# Patient Record
Sex: Male | Born: 1945 | Race: White | Hispanic: No | Marital: Married | Smoking: Former smoker
Health system: Southern US, Community
[De-identification: ages and names within clinical notes are randomized; demographics above are authoritative.]

## PROBLEM LIST (undated history)

## (undated) DIAGNOSIS — R55 Syncope and collapse: Secondary | ICD-10-CM

## (undated) DIAGNOSIS — I495 Sick sinus syndrome: Secondary | ICD-10-CM

## (undated) DIAGNOSIS — Z87898 Personal history of other specified conditions: Secondary | ICD-10-CM

## (undated) DIAGNOSIS — M1990 Unspecified osteoarthritis, unspecified site: Secondary | ICD-10-CM

## (undated) DIAGNOSIS — E782 Mixed hyperlipidemia: Secondary | ICD-10-CM

## (undated) DIAGNOSIS — I2510 Atherosclerotic heart disease of native coronary artery without angina pectoris: Secondary | ICD-10-CM

## (undated) HISTORY — PX: CERVICAL FUSION: SHX112

## (undated) HISTORY — DX: Mixed hyperlipidemia: E78.2

## (undated) HISTORY — DX: Sick sinus syndrome: I49.5

## (undated) HISTORY — DX: Atherosclerotic heart disease of native coronary artery without angina pectoris: I25.10

## (undated) HISTORY — DX: Syncope and collapse: R55

## (undated) HISTORY — PX: HERNIA REPAIR: SHX51

## (undated) HISTORY — PX: SHOULDER ARTHROSCOPY W/ ROTATOR CUFF REPAIR: SHX2400

## (undated) HISTORY — DX: Unspecified osteoarthritis, unspecified site: M19.90

## (undated) HISTORY — DX: Personal history of other specified conditions: Z87.898

## (undated) HISTORY — PX: OTHER SURGICAL HISTORY: SHX169

## (undated) HISTORY — PX: CERVICAL DISC SURGERY: SHX588

---

## 2003-03-16 ENCOUNTER — Ambulatory Visit (HOSPITAL_COMMUNITY): Admission: RE | Admit: 2003-03-16 | Discharge: 2003-03-17 | Payer: Self-pay | Admitting: Orthopaedic Surgery

## 2004-02-09 ENCOUNTER — Encounter: Admission: RE | Admit: 2004-02-09 | Discharge: 2004-02-09 | Payer: Self-pay | Admitting: Orthopaedic Surgery

## 2004-03-21 ENCOUNTER — Inpatient Hospital Stay (HOSPITAL_COMMUNITY): Admission: RE | Admit: 2004-03-21 | Discharge: 2004-03-23 | Payer: Self-pay | Admitting: Orthopaedic Surgery

## 2015-03-07 DIAGNOSIS — R1084 Generalized abdominal pain: Secondary | ICD-10-CM | POA: Diagnosis not present

## 2015-03-08 DIAGNOSIS — M4806 Spinal stenosis, lumbar region: Secondary | ICD-10-CM | POA: Diagnosis not present

## 2015-03-08 DIAGNOSIS — M5136 Other intervertebral disc degeneration, lumbar region: Secondary | ICD-10-CM | POA: Diagnosis not present

## 2015-03-08 DIAGNOSIS — Z79899 Other long term (current) drug therapy: Secondary | ICD-10-CM | POA: Diagnosis not present

## 2015-03-08 DIAGNOSIS — M47817 Spondylosis without myelopathy or radiculopathy, lumbosacral region: Secondary | ICD-10-CM | POA: Diagnosis not present

## 2015-03-08 DIAGNOSIS — Z87891 Personal history of nicotine dependence: Secondary | ICD-10-CM | POA: Diagnosis not present

## 2015-03-08 DIAGNOSIS — Z981 Arthrodesis status: Secondary | ICD-10-CM | POA: Diagnosis not present

## 2015-03-15 DIAGNOSIS — M1990 Unspecified osteoarthritis, unspecified site: Secondary | ICD-10-CM | POA: Diagnosis not present

## 2015-03-15 DIAGNOSIS — Z9889 Other specified postprocedural states: Secondary | ICD-10-CM | POA: Diagnosis not present

## 2015-03-15 DIAGNOSIS — Z7982 Long term (current) use of aspirin: Secondary | ICD-10-CM | POA: Diagnosis not present

## 2015-03-15 DIAGNOSIS — Z79899 Other long term (current) drug therapy: Secondary | ICD-10-CM | POA: Diagnosis not present

## 2015-03-15 DIAGNOSIS — M549 Dorsalgia, unspecified: Secondary | ICD-10-CM | POA: Diagnosis not present

## 2015-03-15 DIAGNOSIS — M8589 Other specified disorders of bone density and structure, multiple sites: Secondary | ICD-10-CM | POA: Diagnosis not present

## 2015-03-15 DIAGNOSIS — M810 Age-related osteoporosis without current pathological fracture: Secondary | ICD-10-CM | POA: Diagnosis not present

## 2015-04-04 DIAGNOSIS — M5440 Lumbago with sciatica, unspecified side: Secondary | ICD-10-CM | POA: Diagnosis not present

## 2015-04-05 DIAGNOSIS — M5136 Other intervertebral disc degeneration, lumbar region: Secondary | ICD-10-CM | POA: Diagnosis not present

## 2015-04-05 DIAGNOSIS — M47817 Spondylosis without myelopathy or radiculopathy, lumbosacral region: Secondary | ICD-10-CM | POA: Diagnosis not present

## 2015-04-06 DIAGNOSIS — M5440 Lumbago with sciatica, unspecified side: Secondary | ICD-10-CM | POA: Diagnosis not present

## 2015-04-10 DIAGNOSIS — M5440 Lumbago with sciatica, unspecified side: Secondary | ICD-10-CM | POA: Diagnosis not present

## 2015-04-12 DIAGNOSIS — M5440 Lumbago with sciatica, unspecified side: Secondary | ICD-10-CM | POA: Diagnosis not present

## 2015-04-17 DIAGNOSIS — M5440 Lumbago with sciatica, unspecified side: Secondary | ICD-10-CM | POA: Diagnosis not present

## 2015-04-19 DIAGNOSIS — M5440 Lumbago with sciatica, unspecified side: Secondary | ICD-10-CM | POA: Diagnosis not present

## 2015-04-24 DIAGNOSIS — M5440 Lumbago with sciatica, unspecified side: Secondary | ICD-10-CM | POA: Diagnosis not present

## 2015-04-26 DIAGNOSIS — M5440 Lumbago with sciatica, unspecified side: Secondary | ICD-10-CM | POA: Diagnosis not present

## 2015-05-01 DIAGNOSIS — M5440 Lumbago with sciatica, unspecified side: Secondary | ICD-10-CM | POA: Diagnosis not present

## 2015-05-03 DIAGNOSIS — M5440 Lumbago with sciatica, unspecified side: Secondary | ICD-10-CM | POA: Diagnosis not present

## 2015-05-08 DIAGNOSIS — M5440 Lumbago with sciatica, unspecified side: Secondary | ICD-10-CM | POA: Diagnosis not present

## 2015-05-10 DIAGNOSIS — M5440 Lumbago with sciatica, unspecified side: Secondary | ICD-10-CM | POA: Diagnosis not present

## 2015-06-19 DIAGNOSIS — M47816 Spondylosis without myelopathy or radiculopathy, lumbar region: Secondary | ICD-10-CM | POA: Diagnosis not present

## 2015-06-19 DIAGNOSIS — M9903 Segmental and somatic dysfunction of lumbar region: Secondary | ICD-10-CM | POA: Diagnosis not present

## 2015-06-19 DIAGNOSIS — M5441 Lumbago with sciatica, right side: Secondary | ICD-10-CM | POA: Diagnosis not present

## 2015-06-22 DIAGNOSIS — M47816 Spondylosis without myelopathy or radiculopathy, lumbar region: Secondary | ICD-10-CM | POA: Diagnosis not present

## 2015-06-22 DIAGNOSIS — M4806 Spinal stenosis, lumbar region: Secondary | ICD-10-CM | POA: Diagnosis not present

## 2015-06-22 DIAGNOSIS — M9903 Segmental and somatic dysfunction of lumbar region: Secondary | ICD-10-CM | POA: Diagnosis not present

## 2015-06-22 DIAGNOSIS — M5441 Lumbago with sciatica, right side: Secondary | ICD-10-CM | POA: Diagnosis not present

## 2015-06-23 DIAGNOSIS — Z6826 Body mass index (BMI) 26.0-26.9, adult: Secondary | ICD-10-CM | POA: Diagnosis not present

## 2015-06-23 DIAGNOSIS — M545 Low back pain: Secondary | ICD-10-CM | POA: Diagnosis not present

## 2015-06-23 DIAGNOSIS — Z125 Encounter for screening for malignant neoplasm of prostate: Secondary | ICD-10-CM | POA: Diagnosis not present

## 2015-06-23 DIAGNOSIS — Z0000 Encounter for general adult medical examination without abnormal findings: Secondary | ICD-10-CM | POA: Diagnosis not present

## 2015-06-23 DIAGNOSIS — I10 Essential (primary) hypertension: Secondary | ICD-10-CM | POA: Diagnosis not present

## 2015-07-03 DIAGNOSIS — M5441 Lumbago with sciatica, right side: Secondary | ICD-10-CM | POA: Diagnosis not present

## 2015-07-03 DIAGNOSIS — M4806 Spinal stenosis, lumbar region: Secondary | ICD-10-CM | POA: Diagnosis not present

## 2015-07-03 DIAGNOSIS — L570 Actinic keratosis: Secondary | ICD-10-CM | POA: Diagnosis not present

## 2015-07-03 DIAGNOSIS — M9903 Segmental and somatic dysfunction of lumbar region: Secondary | ICD-10-CM | POA: Diagnosis not present

## 2015-07-03 DIAGNOSIS — Z85828 Personal history of other malignant neoplasm of skin: Secondary | ICD-10-CM | POA: Diagnosis not present

## 2015-07-03 DIAGNOSIS — M47816 Spondylosis without myelopathy or radiculopathy, lumbar region: Secondary | ICD-10-CM | POA: Diagnosis not present

## 2015-07-07 DIAGNOSIS — M9903 Segmental and somatic dysfunction of lumbar region: Secondary | ICD-10-CM | POA: Diagnosis not present

## 2015-07-07 DIAGNOSIS — M5441 Lumbago with sciatica, right side: Secondary | ICD-10-CM | POA: Diagnosis not present

## 2015-07-07 DIAGNOSIS — M4806 Spinal stenosis, lumbar region: Secondary | ICD-10-CM | POA: Diagnosis not present

## 2015-07-07 DIAGNOSIS — M47816 Spondylosis without myelopathy or radiculopathy, lumbar region: Secondary | ICD-10-CM | POA: Diagnosis not present

## 2015-07-24 DIAGNOSIS — M9903 Segmental and somatic dysfunction of lumbar region: Secondary | ICD-10-CM | POA: Diagnosis not present

## 2015-07-24 DIAGNOSIS — M47816 Spondylosis without myelopathy or radiculopathy, lumbar region: Secondary | ICD-10-CM | POA: Diagnosis not present

## 2015-07-24 DIAGNOSIS — M5441 Lumbago with sciatica, right side: Secondary | ICD-10-CM | POA: Diagnosis not present

## 2015-07-24 DIAGNOSIS — M4806 Spinal stenosis, lumbar region: Secondary | ICD-10-CM | POA: Diagnosis not present

## 2015-07-26 DIAGNOSIS — M47816 Spondylosis without myelopathy or radiculopathy, lumbar region: Secondary | ICD-10-CM | POA: Diagnosis not present

## 2015-07-26 DIAGNOSIS — M9903 Segmental and somatic dysfunction of lumbar region: Secondary | ICD-10-CM | POA: Diagnosis not present

## 2015-07-26 DIAGNOSIS — M4806 Spinal stenosis, lumbar region: Secondary | ICD-10-CM | POA: Diagnosis not present

## 2015-07-26 DIAGNOSIS — M5441 Lumbago with sciatica, right side: Secondary | ICD-10-CM | POA: Diagnosis not present

## 2015-08-01 DIAGNOSIS — M9903 Segmental and somatic dysfunction of lumbar region: Secondary | ICD-10-CM | POA: Diagnosis not present

## 2015-08-01 DIAGNOSIS — M4806 Spinal stenosis, lumbar region: Secondary | ICD-10-CM | POA: Diagnosis not present

## 2015-08-01 DIAGNOSIS — M47816 Spondylosis without myelopathy or radiculopathy, lumbar region: Secondary | ICD-10-CM | POA: Diagnosis not present

## 2015-08-01 DIAGNOSIS — M5441 Lumbago with sciatica, right side: Secondary | ICD-10-CM | POA: Diagnosis not present

## 2015-08-15 DIAGNOSIS — I10 Essential (primary) hypertension: Secondary | ICD-10-CM | POA: Diagnosis not present

## 2015-08-15 DIAGNOSIS — M545 Low back pain: Secondary | ICD-10-CM | POA: Diagnosis not present

## 2015-08-15 DIAGNOSIS — M9903 Segmental and somatic dysfunction of lumbar region: Secondary | ICD-10-CM | POA: Diagnosis not present

## 2015-08-15 DIAGNOSIS — I700 Atherosclerosis of aorta: Secondary | ICD-10-CM | POA: Diagnosis not present

## 2015-08-15 DIAGNOSIS — M47816 Spondylosis without myelopathy or radiculopathy, lumbar region: Secondary | ICD-10-CM | POA: Diagnosis not present

## 2015-08-15 DIAGNOSIS — Z6826 Body mass index (BMI) 26.0-26.9, adult: Secondary | ICD-10-CM | POA: Diagnosis not present

## 2015-08-15 DIAGNOSIS — M4806 Spinal stenosis, lumbar region: Secondary | ICD-10-CM | POA: Diagnosis not present

## 2015-08-15 DIAGNOSIS — M5441 Lumbago with sciatica, right side: Secondary | ICD-10-CM | POA: Diagnosis not present

## 2015-08-22 DIAGNOSIS — M5441 Lumbago with sciatica, right side: Secondary | ICD-10-CM | POA: Diagnosis not present

## 2015-08-22 DIAGNOSIS — M47816 Spondylosis without myelopathy or radiculopathy, lumbar region: Secondary | ICD-10-CM | POA: Diagnosis not present

## 2015-08-22 DIAGNOSIS — M9903 Segmental and somatic dysfunction of lumbar region: Secondary | ICD-10-CM | POA: Diagnosis not present

## 2015-08-22 DIAGNOSIS — M4806 Spinal stenosis, lumbar region: Secondary | ICD-10-CM | POA: Diagnosis not present

## 2015-09-14 DIAGNOSIS — M9903 Segmental and somatic dysfunction of lumbar region: Secondary | ICD-10-CM | POA: Diagnosis not present

## 2015-09-14 DIAGNOSIS — M5441 Lumbago with sciatica, right side: Secondary | ICD-10-CM | POA: Diagnosis not present

## 2015-09-14 DIAGNOSIS — M47816 Spondylosis without myelopathy or radiculopathy, lumbar region: Secondary | ICD-10-CM | POA: Diagnosis not present

## 2015-09-14 DIAGNOSIS — M4806 Spinal stenosis, lumbar region: Secondary | ICD-10-CM | POA: Diagnosis not present

## 2015-09-18 DIAGNOSIS — M9903 Segmental and somatic dysfunction of lumbar region: Secondary | ICD-10-CM | POA: Diagnosis not present

## 2015-09-18 DIAGNOSIS — M47816 Spondylosis without myelopathy or radiculopathy, lumbar region: Secondary | ICD-10-CM | POA: Diagnosis not present

## 2015-09-18 DIAGNOSIS — M4806 Spinal stenosis, lumbar region: Secondary | ICD-10-CM | POA: Diagnosis not present

## 2015-09-18 DIAGNOSIS — M5441 Lumbago with sciatica, right side: Secondary | ICD-10-CM | POA: Diagnosis not present

## 2015-09-21 DIAGNOSIS — M47816 Spondylosis without myelopathy or radiculopathy, lumbar region: Secondary | ICD-10-CM | POA: Diagnosis not present

## 2015-09-21 DIAGNOSIS — M4806 Spinal stenosis, lumbar region: Secondary | ICD-10-CM | POA: Diagnosis not present

## 2015-09-21 DIAGNOSIS — M5441 Lumbago with sciatica, right side: Secondary | ICD-10-CM | POA: Diagnosis not present

## 2015-09-21 DIAGNOSIS — M9903 Segmental and somatic dysfunction of lumbar region: Secondary | ICD-10-CM | POA: Diagnosis not present

## 2015-09-25 DIAGNOSIS — M4806 Spinal stenosis, lumbar region: Secondary | ICD-10-CM | POA: Diagnosis not present

## 2015-09-25 DIAGNOSIS — M9903 Segmental and somatic dysfunction of lumbar region: Secondary | ICD-10-CM | POA: Diagnosis not present

## 2015-09-25 DIAGNOSIS — M5441 Lumbago with sciatica, right side: Secondary | ICD-10-CM | POA: Diagnosis not present

## 2015-09-25 DIAGNOSIS — M47816 Spondylosis without myelopathy or radiculopathy, lumbar region: Secondary | ICD-10-CM | POA: Diagnosis not present

## 2015-09-27 DIAGNOSIS — R1032 Left lower quadrant pain: Secondary | ICD-10-CM | POA: Diagnosis not present

## 2015-09-28 DIAGNOSIS — M9903 Segmental and somatic dysfunction of lumbar region: Secondary | ICD-10-CM | POA: Diagnosis not present

## 2015-09-28 DIAGNOSIS — M47816 Spondylosis without myelopathy or radiculopathy, lumbar region: Secondary | ICD-10-CM | POA: Diagnosis not present

## 2015-09-28 DIAGNOSIS — M4806 Spinal stenosis, lumbar region: Secondary | ICD-10-CM | POA: Diagnosis not present

## 2015-09-28 DIAGNOSIS — M5441 Lumbago with sciatica, right side: Secondary | ICD-10-CM | POA: Diagnosis not present

## 2015-10-05 DIAGNOSIS — M9903 Segmental and somatic dysfunction of lumbar region: Secondary | ICD-10-CM | POA: Diagnosis not present

## 2015-10-05 DIAGNOSIS — M4806 Spinal stenosis, lumbar region: Secondary | ICD-10-CM | POA: Diagnosis not present

## 2015-10-05 DIAGNOSIS — M47816 Spondylosis without myelopathy or radiculopathy, lumbar region: Secondary | ICD-10-CM | POA: Diagnosis not present

## 2015-10-05 DIAGNOSIS — M5441 Lumbago with sciatica, right side: Secondary | ICD-10-CM | POA: Diagnosis not present

## 2015-10-06 DIAGNOSIS — R1032 Left lower quadrant pain: Secondary | ICD-10-CM | POA: Diagnosis not present

## 2015-10-06 DIAGNOSIS — R1030 Lower abdominal pain, unspecified: Secondary | ICD-10-CM | POA: Diagnosis not present

## 2015-10-06 DIAGNOSIS — I861 Scrotal varices: Secondary | ICD-10-CM | POA: Diagnosis not present

## 2015-10-09 DIAGNOSIS — M47816 Spondylosis without myelopathy or radiculopathy, lumbar region: Secondary | ICD-10-CM | POA: Diagnosis not present

## 2015-10-09 DIAGNOSIS — M9903 Segmental and somatic dysfunction of lumbar region: Secondary | ICD-10-CM | POA: Diagnosis not present

## 2015-10-09 DIAGNOSIS — M4806 Spinal stenosis, lumbar region: Secondary | ICD-10-CM | POA: Diagnosis not present

## 2015-10-09 DIAGNOSIS — M5441 Lumbago with sciatica, right side: Secondary | ICD-10-CM | POA: Diagnosis not present

## 2015-10-11 DIAGNOSIS — I861 Scrotal varices: Secondary | ICD-10-CM | POA: Diagnosis not present

## 2015-10-12 DIAGNOSIS — M9903 Segmental and somatic dysfunction of lumbar region: Secondary | ICD-10-CM | POA: Diagnosis not present

## 2015-10-12 DIAGNOSIS — M4806 Spinal stenosis, lumbar region: Secondary | ICD-10-CM | POA: Diagnosis not present

## 2015-10-12 DIAGNOSIS — M5441 Lumbago with sciatica, right side: Secondary | ICD-10-CM | POA: Diagnosis not present

## 2015-10-12 DIAGNOSIS — M47816 Spondylosis without myelopathy or radiculopathy, lumbar region: Secondary | ICD-10-CM | POA: Diagnosis not present

## 2015-10-18 DIAGNOSIS — M4806 Spinal stenosis, lumbar region: Secondary | ICD-10-CM | POA: Diagnosis not present

## 2015-10-18 DIAGNOSIS — M47816 Spondylosis without myelopathy or radiculopathy, lumbar region: Secondary | ICD-10-CM | POA: Diagnosis not present

## 2015-10-18 DIAGNOSIS — M5441 Lumbago with sciatica, right side: Secondary | ICD-10-CM | POA: Diagnosis not present

## 2015-10-18 DIAGNOSIS — M9903 Segmental and somatic dysfunction of lumbar region: Secondary | ICD-10-CM | POA: Diagnosis not present

## 2015-11-08 DIAGNOSIS — M5441 Lumbago with sciatica, right side: Secondary | ICD-10-CM | POA: Diagnosis not present

## 2015-11-08 DIAGNOSIS — M9903 Segmental and somatic dysfunction of lumbar region: Secondary | ICD-10-CM | POA: Diagnosis not present

## 2015-11-08 DIAGNOSIS — M4806 Spinal stenosis, lumbar region: Secondary | ICD-10-CM | POA: Diagnosis not present

## 2015-11-08 DIAGNOSIS — M47816 Spondylosis without myelopathy or radiculopathy, lumbar region: Secondary | ICD-10-CM | POA: Diagnosis not present

## 2015-11-13 DIAGNOSIS — M4806 Spinal stenosis, lumbar region: Secondary | ICD-10-CM | POA: Diagnosis not present

## 2015-11-13 DIAGNOSIS — M5441 Lumbago with sciatica, right side: Secondary | ICD-10-CM | POA: Diagnosis not present

## 2015-11-13 DIAGNOSIS — M47816 Spondylosis without myelopathy or radiculopathy, lumbar region: Secondary | ICD-10-CM | POA: Diagnosis not present

## 2015-11-13 DIAGNOSIS — M9903 Segmental and somatic dysfunction of lumbar region: Secondary | ICD-10-CM | POA: Diagnosis not present

## 2015-11-16 DIAGNOSIS — I700 Atherosclerosis of aorta: Secondary | ICD-10-CM | POA: Diagnosis not present

## 2015-11-16 DIAGNOSIS — M545 Low back pain: Secondary | ICD-10-CM | POA: Diagnosis not present

## 2015-11-16 DIAGNOSIS — E298 Other testicular dysfunction: Secondary | ICD-10-CM | POA: Diagnosis not present

## 2015-11-16 DIAGNOSIS — I10 Essential (primary) hypertension: Secondary | ICD-10-CM | POA: Diagnosis not present

## 2015-11-16 DIAGNOSIS — I808 Phlebitis and thrombophlebitis of other sites: Secondary | ICD-10-CM | POA: Diagnosis not present

## 2015-11-16 DIAGNOSIS — Z6825 Body mass index (BMI) 25.0-25.9, adult: Secondary | ICD-10-CM | POA: Diagnosis not present

## 2015-11-20 DIAGNOSIS — M9903 Segmental and somatic dysfunction of lumbar region: Secondary | ICD-10-CM | POA: Diagnosis not present

## 2015-11-20 DIAGNOSIS — M47816 Spondylosis without myelopathy or radiculopathy, lumbar region: Secondary | ICD-10-CM | POA: Diagnosis not present

## 2015-11-20 DIAGNOSIS — M5441 Lumbago with sciatica, right side: Secondary | ICD-10-CM | POA: Diagnosis not present

## 2015-11-20 DIAGNOSIS — M4806 Spinal stenosis, lumbar region: Secondary | ICD-10-CM | POA: Diagnosis not present

## 2015-11-23 ENCOUNTER — Other Ambulatory Visit: Payer: Self-pay | Admitting: Internal Medicine

## 2015-11-23 DIAGNOSIS — M545 Low back pain: Principal | ICD-10-CM

## 2015-11-23 DIAGNOSIS — G8929 Other chronic pain: Secondary | ICD-10-CM

## 2015-11-28 DIAGNOSIS — M5441 Lumbago with sciatica, right side: Secondary | ICD-10-CM | POA: Diagnosis not present

## 2015-11-28 DIAGNOSIS — M47816 Spondylosis without myelopathy or radiculopathy, lumbar region: Secondary | ICD-10-CM | POA: Diagnosis not present

## 2015-11-28 DIAGNOSIS — M4806 Spinal stenosis, lumbar region: Secondary | ICD-10-CM | POA: Diagnosis not present

## 2015-11-28 DIAGNOSIS — M9903 Segmental and somatic dysfunction of lumbar region: Secondary | ICD-10-CM | POA: Diagnosis not present

## 2015-12-05 DIAGNOSIS — M4806 Spinal stenosis, lumbar region: Secondary | ICD-10-CM | POA: Diagnosis not present

## 2015-12-05 DIAGNOSIS — M47816 Spondylosis without myelopathy or radiculopathy, lumbar region: Secondary | ICD-10-CM | POA: Diagnosis not present

## 2015-12-05 DIAGNOSIS — M5441 Lumbago with sciatica, right side: Secondary | ICD-10-CM | POA: Diagnosis not present

## 2015-12-05 DIAGNOSIS — M9903 Segmental and somatic dysfunction of lumbar region: Secondary | ICD-10-CM | POA: Diagnosis not present

## 2015-12-12 ENCOUNTER — Ambulatory Visit
Admission: RE | Admit: 2015-12-12 | Discharge: 2015-12-12 | Disposition: A | Discharge status: Home / Self Care | Payer: PPO | Source: Ambulatory Visit | Attending: Internal Medicine | Admitting: Internal Medicine

## 2015-12-12 ENCOUNTER — Other Ambulatory Visit: Payer: Self-pay | Admitting: Internal Medicine

## 2015-12-12 DIAGNOSIS — M545 Low back pain, unspecified: Secondary | ICD-10-CM

## 2015-12-12 DIAGNOSIS — M47816 Spondylosis without myelopathy or radiculopathy, lumbar region: Secondary | ICD-10-CM | POA: Diagnosis not present

## 2015-12-12 DIAGNOSIS — G8929 Other chronic pain: Secondary | ICD-10-CM

## 2015-12-12 MED ORDER — METHYLPREDNISOLONE ACETATE 40 MG/ML INJ SUSP (RADIOLOG
120.0000 mg | Freq: Once | INTRAMUSCULAR | Status: AC
  Administered 2015-12-12: 120 mg via EPIDURAL

## 2015-12-12 MED ORDER — IOPAMIDOL (ISOVUE-M 200) INJECTION 41%
1.0000 mL | Freq: Once | INTRAMUSCULAR | Status: AC
  Administered 2015-12-12: 1 mL via EPIDURAL

## 2015-12-12 NOTE — Discharge Instructions (Signed)

## 2015-12-19 DIAGNOSIS — M47816 Spondylosis without myelopathy or radiculopathy, lumbar region: Secondary | ICD-10-CM | POA: Diagnosis not present

## 2015-12-19 DIAGNOSIS — M5441 Lumbago with sciatica, right side: Secondary | ICD-10-CM | POA: Diagnosis not present

## 2015-12-19 DIAGNOSIS — M9903 Segmental and somatic dysfunction of lumbar region: Secondary | ICD-10-CM | POA: Diagnosis not present

## 2015-12-19 DIAGNOSIS — M4806 Spinal stenosis, lumbar region: Secondary | ICD-10-CM | POA: Diagnosis not present

## 2016-01-02 DIAGNOSIS — M4806 Spinal stenosis, lumbar region: Secondary | ICD-10-CM | POA: Diagnosis not present

## 2016-01-02 DIAGNOSIS — M47816 Spondylosis without myelopathy or radiculopathy, lumbar region: Secondary | ICD-10-CM | POA: Diagnosis not present

## 2016-01-02 DIAGNOSIS — M5441 Lumbago with sciatica, right side: Secondary | ICD-10-CM | POA: Diagnosis not present

## 2016-01-02 DIAGNOSIS — M9903 Segmental and somatic dysfunction of lumbar region: Secondary | ICD-10-CM | POA: Diagnosis not present

## 2016-01-08 DIAGNOSIS — I700 Atherosclerosis of aorta: Secondary | ICD-10-CM | POA: Diagnosis not present

## 2016-01-08 DIAGNOSIS — L570 Actinic keratosis: Secondary | ICD-10-CM | POA: Diagnosis not present

## 2016-01-08 DIAGNOSIS — M545 Low back pain: Secondary | ICD-10-CM | POA: Diagnosis not present

## 2016-01-08 DIAGNOSIS — I10 Essential (primary) hypertension: Secondary | ICD-10-CM | POA: Diagnosis not present

## 2016-01-08 DIAGNOSIS — Z85828 Personal history of other malignant neoplasm of skin: Secondary | ICD-10-CM | POA: Diagnosis not present

## 2016-01-16 DIAGNOSIS — M47816 Spondylosis without myelopathy or radiculopathy, lumbar region: Secondary | ICD-10-CM | POA: Diagnosis not present

## 2016-01-16 DIAGNOSIS — M4806 Spinal stenosis, lumbar region: Secondary | ICD-10-CM | POA: Diagnosis not present

## 2016-01-16 DIAGNOSIS — M9903 Segmental and somatic dysfunction of lumbar region: Secondary | ICD-10-CM | POA: Diagnosis not present

## 2016-01-16 DIAGNOSIS — M5441 Lumbago with sciatica, right side: Secondary | ICD-10-CM | POA: Diagnosis not present

## 2016-01-19 DIAGNOSIS — M1612 Unilateral primary osteoarthritis, left hip: Secondary | ICD-10-CM | POA: Diagnosis not present

## 2016-01-19 DIAGNOSIS — M25552 Pain in left hip: Secondary | ICD-10-CM | POA: Diagnosis not present

## 2016-02-06 DIAGNOSIS — I10 Essential (primary) hypertension: Secondary | ICD-10-CM | POA: Diagnosis not present

## 2016-02-06 DIAGNOSIS — M1612 Unilateral primary osteoarthritis, left hip: Secondary | ICD-10-CM | POA: Diagnosis not present

## 2016-02-06 DIAGNOSIS — E298 Other testicular dysfunction: Secondary | ICD-10-CM | POA: Diagnosis not present

## 2016-02-12 DIAGNOSIS — M5441 Lumbago with sciatica, right side: Secondary | ICD-10-CM | POA: Diagnosis not present

## 2016-02-12 DIAGNOSIS — M47816 Spondylosis without myelopathy or radiculopathy, lumbar region: Secondary | ICD-10-CM | POA: Diagnosis not present

## 2016-02-12 DIAGNOSIS — M9903 Segmental and somatic dysfunction of lumbar region: Secondary | ICD-10-CM | POA: Diagnosis not present

## 2016-02-12 DIAGNOSIS — M4806 Spinal stenosis, lumbar region: Secondary | ICD-10-CM | POA: Diagnosis not present

## 2016-02-14 NOTE — Progress Notes (Signed)
Pt is being scheduled for preop appt; please place surgical orders in epic. Thanks.  

## 2016-02-15 ENCOUNTER — Ambulatory Visit: Payer: Self-pay | Admitting: Orthopedic Surgery

## 2016-02-23 DIAGNOSIS — M545 Low back pain: Secondary | ICD-10-CM | POA: Diagnosis not present

## 2016-02-23 DIAGNOSIS — E784 Other hyperlipidemia: Secondary | ICD-10-CM | POA: Diagnosis not present

## 2016-02-23 DIAGNOSIS — I10 Essential (primary) hypertension: Secondary | ICD-10-CM | POA: Diagnosis not present

## 2016-03-06 ENCOUNTER — Ambulatory Visit: Payer: Self-pay | Admitting: Orthopedic Surgery

## 2016-03-06 NOTE — H&P (Signed)
TOTAL HIP ADMISSION H&P  Patient is admitted for left total hip arthroplasty.  Subjective:  Chief Complaint: left hip pain  HPI: Theodore Hutchinson, 71 y.o. male, has a history of pain and functional disability in the left hip(s) due to arthritis and patient has failed non-surgical conservative treatments for greater than 12 weeks to include NSAID's and/or analgesics, flexibility and strengthening excercises, use of assistive devices and activity modification.  Onset of symptoms was gradual starting 5 years ago with rapidlly worsening course since that time.The patient noted no past surgery on the left hip(s).  Patient currently rates pain in the left hip at 10 out of 10 with activity. Patient has night pain, worsening of pain with activity and weight bearing, pain that interfers with activities of daily living, pain with passive range of motion and crepitus. Patient has evidence of subchondral cysts, subchondral sclerosis, periarticular osteophytes and joint space narrowing by imaging studies. This condition presents safety issues increasing the risk of falls. There is no current active infection.  There are no active problems to display for this patient.  No past medical history on file.  No past surgical history on file.   (Not in a hospital admission) No Known Allergies  Social History  Substance Use Topics  . Smoking status: Not on file  . Smokeless tobacco: Not on file  . Alcohol use Not on file    No family history on file.   Review of Systems  Constitutional: Negative.   HENT: Positive for hearing loss.   Eyes: Negative.   Respiratory: Negative.   Cardiovascular: Negative.   Gastrointestinal: Negative.   Genitourinary: Negative.   Musculoskeletal: Positive for joint pain and myalgias.  Skin: Negative.   Neurological: Negative.   Endo/Heme/Allergies: Negative.   Psychiatric/Behavioral: Negative.     Objective:  Physical Exam  Vitals reviewed. Constitutional: He is  oriented to person, place, and time. He appears well-developed and well-nourished.  HENT:  Head: Normocephalic and atraumatic.  Eyes: Conjunctivae and EOM are normal. Pupils are equal, round, and reactive to light.  Neck: Normal range of motion. Neck supple.  Cardiovascular: Normal rate, regular rhythm and intact distal pulses.   Respiratory: Effort normal. No respiratory distress.  GI: Soft. He exhibits no distension.  Genitourinary:  Genitourinary Comments: deferred  Musculoskeletal:       Left hip: He exhibits decreased range of motion, tenderness and crepitus.  Neurological: He is alert and oriented to person, place, and time. He has normal reflexes.  Skin: Skin is warm and dry.  Psychiatric: He has a normal mood and affect. His behavior is normal. Judgment and thought content normal.    Vital signs in last 24 hours: @VSRANGES @  Labs:   There is no height or weight on file to calculate BMI.   Imaging Review Plain radiographs demonstrate severe degenerative joint disease of the left hip(s). The bone quality appears to be adequate for age and reported activity level.  Assessment/Plan:  End stage arthritis, left hip(s)  The patient history, physical examination, clinical judgement of the provider and imaging studies are consistent with end stage degenerative joint disease of the left hip(s) and total hip arthroplasty is deemed medically necessary. The treatment options including medical management, injection therapy, arthroscopy and arthroplasty were discussed at length. The risks and benefits of total hip arthroplasty were presented and reviewed. The risks due to aseptic loosening, infection, stiffness, dislocation/subluxation,  thromboembolic complications and other imponderables were discussed.  The patient acknowledged the explanation, agreed to proceed  with the plan and consent was signed. Patient is being admitted for inpatient treatment for surgery, pain control, PT, OT,  prophylactic antibiotics, VTE prophylaxis, progressive ambulation and ADL's and discharge planning.The patient is planning to be discharged home with home health services

## 2016-03-06 NOTE — Patient Instructions (Addendum)
Theodore Hutchinson  03/06/2016   Your procedure is scheduled on: 03-14-16  Report to [facility] Main  Entrance take [facility]  elevators to 3rd floor to  [place] at    1000 AM.  Call this number if you have problems the morning of surgery [phone]   Remember: ONLY 1 PERSON MAY GO WITH YOU TO SHORT STAY TO GET  READY MORNING OF [place].  Do not eat food or drink liquids :After Midnight.     Take these medicines the morning of surgery with A SIP OF WATER: Tramadol. DO NOT TAKE ANY DIABETIC MEDICATIONS DAY OF YOUR SURGERY                               You may not have any metal on your body including hair pins and              piercings  Do not wear jewelry, make-up, lotions, powders or perfumes, deodorant             Do not wear nail polish.  Do not shave  48 hours prior to surgery.              Men may shave face and neck.   Do not bring valuables to the hospital. [place].  Contacts, dentures or bridgework may not be worn into surgery.  Leave suitcase in the car. After surgery it may be brought to your room.     Patients discharged the day of surgery will not be allowed to drive home.  Name and phone number of your driver:Theodore Hutchinson - spouse [phone] home  Special Instructions: N/A              Please read over the following fact sheets you were given: _____________________________________________________________________             [facility] - Preparing for Surgery Before surgery, you can play an important role.  Because skin is not sterile, your skin needs to be as free of germs as possible.  You can reduce the number of germs on your skin by washing with CHG (chlorahexidine gluconate) soap before surgery.  CHG is an antiseptic cleaner which kills germs and bonds with the skin to continue killing germs even after washing. Please DO NOT use if you have an allergy to CHG or antibacterial soaps.   If your skin becomes reddened/irritated stop using the CHG and inform your nurse when you arrive at Short Stay. Do not shave (including legs and underarms) for at least 48 hours prior to the first CHG shower.  You may shave your face/neck. Please follow these instructions carefully:  1.  Shower with CHG Soap the night before surgery and the  morning of Surgery.  2.  If you choose to wash your hair, wash your hair first as usual with your  normal  shampoo.  3.  After you shampoo, rinse your hair and body thoroughly to remove the  shampoo.                           4.  Use CHG as you would any other liquid soap.  You can apply chg directly  to the skin and wash                       Gently with a scrungie or clean washcloth.  5.  Apply the CHG Soap to your body ONLY FROM THE NECK DOWN.   Do not use on face/ open                           Wound or open sores. Avoid contact with eyes, ears mouth and genitals (private parts).                       Wash face,  Genitals (private parts) with your normal soap.             6.  Wash thoroughly, paying special attention to the area where your surgery  will be performed.  7.  Thoroughly rinse your body with warm water from the neck down.  8.  DO NOT shower/wash with your normal soap after using and rinsing off  the CHG Soap.                9.  Pat yourself dry with a clean towel.            10.  Wear clean pajamas.            11.  Place clean sheets on your bed the night of your first shower and do not  sleep with pets. Day of Surgery : Do not apply any lotions/deodorants the morning of surgery.  Please wear clean clothes to the hospital/surgery center.  FAILURE TO FOLLOW THESE INSTRUCTIONS MAY RESULT IN THE CANCELLATION OF YOUR SURGERY PATIENT SIGNATURE_________________________________  NURSE SIGNATURE__________________________________  ________________________________________________________________________   Theodore Hutchinson  An incentive  spirometer is a tool that can help keep your lungs clear and active. This tool measures how well you are filling your lungs with each breath. Taking long deep breaths may help reverse or decrease the chance of developing breathing (pulmonary) problems (especially infection) following:  A long period of time when you are unable to move or be active. BEFORE THE PROCEDURE   If the spirometer includes an indicator to show your best effort, your nurse or respiratory therapist will set it to a desired goal.  If possible, sit up straight or lean slightly forward. Try not to slouch.  Hold the incentive spirometer in an upright position. INSTRUCTIONS FOR USE  1. Sit on the edge of your bed if possible, or sit up as far as you can in bed or on a chair. 2. Hold the incentive spirometer in an upright position. 3. Breathe out normally. 4. Place the mouthpiece in your mouth and seal your lips tightly around it. 5. Breathe in slowly and as deeply as possible, raising the piston or the ball toward the top of the column. 6. Hold your breath for 3-5 seconds or for as long as possible. Allow the piston or ball to fall to the bottom of the column. 7. Remove the mouthpiece from your mouth and breathe out normally. 8. Rest for a few seconds and repeat Steps 1 through 7 at least 10 times every 1-2 hours when you are awake. Take your time and take a few normal breaths between deep breaths. 9. The spirometer may include an indicator to show your best effort. Use the indicator as a goal to work toward during each repetition. 10. After each  set of 10 deep breaths, practice coughing to be sure your lungs are clear. If you have an incision (the cut made at the time of surgery), support your incision when coughing by placing a pillow or rolled up towels firmly against it. Once you are able to get out of bed, walk around indoors and cough well. You may stop using the incentive spirometer when instructed by your caregiver.   RISKS AND COMPLICATIONS  Take your time so you do not get dizzy or light-headed.  If you are in pain, you may need to take or ask for pain medication before doing incentive spirometry. It is harder to take a deep breath if you are having pain. AFTER USE  Rest and breathe slowly and easily.  It can be helpful to keep track of a log of your progress. Your caregiver can provide you with a simple table to help with this. If you are using the spirometer at home, follow these instructions: [place] IF:   You are having difficultly using the spirometer.  You have trouble using the spirometer as often as instructed.  Your pain medication is not giving enough relief while using the spirometer.  You develop fever of 100.5 F (38.1 C) or higher. SEEK IMMEDIATE MEDICAL CARE IF:   You cough up bloody sputum that had not been present before.  You develop fever of 102 F (38.9 C) or greater.  You develop worsening pain at or near the incision site. MAKE SURE YOU:   Understand these instructions.  Will watch your condition.  Will get help right away if you are not doing well or get worse. Document Released: 07/01/2006 Document Revised: 05/13/2011 Document Reviewed: 09/01/2006 Exit[employer] Patient Information 2014 [employer], [place].   ________________________________________________________________________  WHAT IS A BLOOD TRANSFUSION? Blood Transfusion Information  A transfusion is the replacement of blood or some of its parts. Blood is made up of multiple cells which provide different functions.  Red blood cells carry oxygen and are used for blood loss replacement.  White blood cells fight against infection.  Platelets control bleeding.  Plasma helps clot blood.  Other blood products are available for specialized needs, such as hemophilia or other clotting disorders. BEFORE THE TRANSFUSION  Who gives blood for transfusions?   Healthy volunteers who are fully evaluated  to make sure their blood is safe. This is blood bank blood. Transfusion therapy is the safest it has ever been in the practice of medicine. Before blood is taken from a donor, a complete history is taken to make sure that person has no history of diseases nor engages in risky social behavior (examples are intravenous drug use or sexual activity with multiple partners). The donor's travel history is screened to minimize risk of transmitting infections, such as malaria. The donated blood is tested for signs of infectious diseases, such as HIV and hepatitis. The blood is then tested to be sure it is compatible with you in order to minimize the chance of a transfusion reaction. If you or a relative donates blood, this is often done in anticipation of surgery and is not appropriate for emergency situations. It takes many days to process the donated blood. RISKS AND COMPLICATIONS Although transfusion therapy is very safe and saves many lives, the main dangers of transfusion include:   Getting an infectious disease.  Developing a transfusion reaction. This is an allergic reaction to something in the blood you were given. Every precaution is taken to prevent this. The decision to have  a blood transfusion has been considered carefully by your caregiver before blood is given. Blood is not given unless the benefits outweigh the risks. AFTER THE TRANSFUSION  Right after receiving a blood transfusion, you will usually feel much better and more energetic. This is especially true if your red blood cells have gotten low (anemic). The transfusion raises the level of the red blood cells which carry oxygen, and this usually causes an energy increase.  The nurse administering the transfusion will monitor you carefully for complications. HOME CARE INSTRUCTIONS  No special instructions are needed after a transfusion. You may find your energy is better. Speak with your caregiver about any limitations on activity for  underlying diseases you may have. SEEK MEDICAL CARE IF:   Your condition is not improving after your transfusion.  You develop redness or irritation at the intravenous (IV) site. SEEK IMMEDIATE MEDICAL CARE IF:  Any of the following symptoms occur over the next 12 hours:  Shaking chills.  You have a temperature by mouth above 102 F (38.9 C), not controlled by medicine.  Chest, back, or muscle pain.  People around you feel you are not acting correctly or are confused.  Shortness of breath or difficulty breathing.  Dizziness and fainting.  You get a rash or develop hives.  You have a decrease in urine output.  Your urine turns a dark color or changes to pink, red, or brown. Any of the following symptoms occur over the next 10 days:  You have a temperature by mouth above 102 F (38.9 C), not controlled by medicine.  Shortness of breath.  Weakness after normal activity.  The white part of the eye turns yellow (jaundice).  You have a decrease in the amount of urine or are urinating less often.  Your urine turns a dark color or changes to pink, red, or brown. Document Released: 02/16/2000 Document Revised: 05/13/2011 Document Reviewed: 10/05/2007 [facility] Patient Information 2014 [place], [place].  _______________________________________________________________________

## 2016-03-07 ENCOUNTER — Encounter (HOSPITAL_COMMUNITY): Payer: Self-pay

## 2016-03-07 ENCOUNTER — Encounter (HOSPITAL_COMMUNITY)
Admission: RE | Admit: 2016-03-07 | Discharge: 2016-03-07 | Disposition: A | Discharge status: Home / Self Care | Payer: PPO | Source: Ambulatory Visit | Attending: Orthopedic Surgery | Admitting: Orthopedic Surgery

## 2016-03-07 DIAGNOSIS — Z01812 Encounter for preprocedural laboratory examination: Secondary | ICD-10-CM | POA: Insufficient documentation

## 2016-03-07 DIAGNOSIS — Z0183 Encounter for blood typing: Secondary | ICD-10-CM | POA: Insufficient documentation

## 2016-03-07 DIAGNOSIS — M1612 Unilateral primary osteoarthritis, left hip: Secondary | ICD-10-CM | POA: Diagnosis not present

## 2016-03-07 HISTORY — DX: Unspecified osteoarthritis, unspecified site: M19.90

## 2016-03-07 LAB — CBC
HEMATOCRIT: 33.8 % — AB (ref 39.0–52.0)
Hemoglobin: 12.3 g/dL — ABNORMAL LOW (ref 13.0–17.0)
MCH: 35.1 pg — ABNORMAL HIGH (ref 26.0–34.0)
MCHC: 36.4 g/dL — ABNORMAL HIGH (ref 30.0–36.0)
MCV: 96.6 fL (ref 78.0–100.0)
Platelets: 208 10*3/uL (ref 150–400)
RBC: 3.5 MIL/uL — ABNORMAL LOW (ref 4.22–5.81)
RDW: 15 % (ref 11.5–15.5)
WBC: 5.6 10*3/uL (ref 4.0–10.5)

## 2016-03-07 LAB — SURGICAL PCR SCREEN
MRSA, PCR: NEGATIVE
Staphylococcus aureus: NEGATIVE

## 2016-03-07 LAB — ABO/RH: ABO/RH(D): O POS

## 2016-03-07 NOTE — Pre-Procedure Instructions (Signed)
Clearance, LOV notes Dr. Sherrie Sport with chart 02-06-16.

## 2016-03-14 ENCOUNTER — Encounter (HOSPITAL_COMMUNITY)
Admission: RE | Disposition: A | Discharge status: Home Health | Payer: Self-pay | Source: Ambulatory Visit | Attending: Orthopedic Surgery

## 2016-03-14 ENCOUNTER — Inpatient Hospital Stay (HOSPITAL_COMMUNITY): Payer: PPO | Admitting: Certified Registered Nurse Anesthetist

## 2016-03-14 ENCOUNTER — Inpatient Hospital Stay (HOSPITAL_COMMUNITY)
Admission: RE | Admit: 2016-03-14 | Discharge: 2016-03-15 | DRG: 470 | Disposition: A | Discharge status: Home Health | Payer: PPO | Source: Ambulatory Visit | Attending: Orthopedic Surgery | Admitting: Orthopedic Surgery

## 2016-03-14 ENCOUNTER — Inpatient Hospital Stay (HOSPITAL_COMMUNITY): Payer: PPO

## 2016-03-14 ENCOUNTER — Encounter (HOSPITAL_COMMUNITY): Payer: Self-pay | Admitting: *Deleted

## 2016-03-14 DIAGNOSIS — Z87891 Personal history of nicotine dependence: Secondary | ICD-10-CM

## 2016-03-14 DIAGNOSIS — Z471 Aftercare following joint replacement surgery: Secondary | ICD-10-CM | POA: Diagnosis not present

## 2016-03-14 DIAGNOSIS — M1612 Unilateral primary osteoarthritis, left hip: Principal | ICD-10-CM | POA: Diagnosis present

## 2016-03-14 DIAGNOSIS — Z419 Encounter for procedure for purposes other than remedying health state, unspecified: Secondary | ICD-10-CM

## 2016-03-14 DIAGNOSIS — Z09 Encounter for follow-up examination after completed treatment for conditions other than malignant neoplasm: Secondary | ICD-10-CM

## 2016-03-14 DIAGNOSIS — Z96642 Presence of left artificial hip joint: Secondary | ICD-10-CM | POA: Diagnosis not present

## 2016-03-14 HISTORY — PX: TOTAL HIP ARTHROPLASTY: SHX124

## 2016-03-14 LAB — TYPE AND SCREEN
ABO/RH(D): O POS
Antibody Screen: NEGATIVE

## 2016-03-14 SURGERY — ARTHROPLASTY, HIP, TOTAL, ANTERIOR APPROACH
Anesthesia: Spinal | Site: Hip | Laterality: Left

## 2016-03-14 MED ORDER — ACETAMINOPHEN 650 MG RE SUPP: 650.0000 mg | Freq: Four times a day (QID) | RECTAL | Status: DC | PRN

## 2016-03-14 MED ORDER — SODIUM CHLORIDE 0.9 % IJ SOLN
INTRAMUSCULAR | Status: DC | PRN
  Administered 2016-03-14: 30 mL

## 2016-03-14 MED ORDER — MIDAZOLAM HCL 5 MG/5ML IJ SOLN
INTRAMUSCULAR | Status: DC | PRN
  Administered 2016-03-14: 2 mg via INTRAVENOUS

## 2016-03-14 MED ORDER — LACTATED RINGERS IV SOLN
INTRAVENOUS | Status: DC
  Administered 2016-03-14 (×3): via INTRAVENOUS
  Administered 2016-03-14: 1000 mL via INTRAVENOUS

## 2016-03-14 MED ORDER — FENTANYL CITRATE (PF) 100 MCG/2ML IJ SOLN
INTRAMUSCULAR | Status: DC | PRN
  Administered 2016-03-14 (×2): 50 ug via INTRAVENOUS

## 2016-03-14 MED ORDER — FIBER 625 MG PO TABS: ORAL_TABLET | Freq: Every day | ORAL | Status: DC

## 2016-03-14 MED ORDER — MENTHOL 3 MG MT LOZG: 1.0000 | LOZENGE | OROMUCOSAL | Status: DC | PRN

## 2016-03-14 MED ORDER — PROPOFOL 10 MG/ML IV BOLUS
INTRAVENOUS | Status: AC
  Filled 2016-03-14: qty 60

## 2016-03-14 MED ORDER — CEFAZOLIN SODIUM-DEXTROSE 2-4 GM/100ML-% IV SOLN
2.0000 g | Freq: Four times a day (QID) | INTRAVENOUS | Status: AC
  Administered 2016-03-14 – 2016-03-15 (×2): 2 g via INTRAVENOUS
  Filled 2016-03-14 (×2): qty 100

## 2016-03-14 MED ORDER — BUPIVACAINE HCL (PF) 0.5 % IJ SOLN
INTRAMUSCULAR | Status: AC
  Filled 2016-03-14: qty 30

## 2016-03-14 MED ORDER — SODIUM CHLORIDE 0.9 % IR SOLN
Status: DC | PRN
  Administered 2016-03-14: 1000 mL

## 2016-03-14 MED ORDER — OXYCODONE HCL 5 MG PO TABS: 5.0000 mg | ORAL_TABLET | Freq: Once | ORAL | Status: DC | PRN

## 2016-03-14 MED ORDER — SODIUM CHLORIDE 0.9 % IV SOLN
INTRAVENOUS | Status: DC
  Administered 2016-03-14: 19:00:00 150 mL/h via INTRAVENOUS
  Administered 2016-03-15: 02:00:00 via INTRAVENOUS

## 2016-03-14 MED ORDER — WATER FOR IRRIGATION, STERILE IR SOLN
Status: DC | PRN
  Administered 2016-03-14: 3000 mL

## 2016-03-14 MED ORDER — DIPHENHYDRAMINE HCL 12.5 MG/5ML PO ELIX: 12.5000 mg | ORAL_SOLUTION | ORAL | Status: DC | PRN

## 2016-03-14 MED ORDER — PROPOFOL 10 MG/ML IV BOLUS
INTRAVENOUS | Status: AC
  Filled 2016-03-14: qty 20

## 2016-03-14 MED ORDER — CALCIUM POLYCARBOPHIL 625 MG PO TABS
625.0000 mg | ORAL_TABLET | Freq: Every day | ORAL | Status: DC
  Administered 2016-03-15: 625 mg via ORAL
  Filled 2016-03-14: qty 1

## 2016-03-14 MED ORDER — METHOCARBAMOL 500 MG PO TABS: 500.0000 mg | ORAL_TABLET | Freq: Four times a day (QID) | ORAL | Status: DC | PRN

## 2016-03-14 MED ORDER — ONDANSETRON HCL 4 MG/2ML IJ SOLN
INTRAMUSCULAR | Status: DC | PRN
  Administered 2016-03-14: 4 mg via INTRAVENOUS

## 2016-03-14 MED ORDER — SODIUM CHLORIDE 0.9 % IV SOLN: INTRAVENOUS | Status: DC

## 2016-03-14 MED ORDER — PHENYLEPHRINE HCL 10 MG/ML IJ SOLN
INTRAMUSCULAR | Status: DC | PRN
  Administered 2016-03-14 (×2): 40 ug via INTRAVENOUS
  Administered 2016-03-14 (×6): 80 ug via INTRAVENOUS

## 2016-03-14 MED ORDER — ACETAMINOPHEN 325 MG PO TABS: 650.0000 mg | ORAL_TABLET | Freq: Four times a day (QID) | ORAL | Status: DC | PRN

## 2016-03-14 MED ORDER — ONDANSETRON HCL 4 MG/2ML IJ SOLN
INTRAMUSCULAR | Status: AC
  Filled 2016-03-14: qty 2

## 2016-03-14 MED ORDER — KETOROLAC TROMETHAMINE 15 MG/ML IJ SOLN
7.5000 mg | Freq: Four times a day (QID) | INTRAMUSCULAR | Status: DC
  Administered 2016-03-14 – 2016-03-15 (×2): 7.5 mg via INTRAVENOUS
  Filled 2016-03-14 (×2): qty 1

## 2016-03-14 MED ORDER — TRANEXAMIC ACID 1000 MG/10ML IV SOLN
1000.0000 mg | Freq: Once | INTRAVENOUS | Status: AC
  Administered 2016-03-14: 20:00:00 1000 mg via INTRAVENOUS
  Filled 2016-03-14: qty 10

## 2016-03-14 MED ORDER — PROPOFOL 500 MG/50ML IV EMUL
INTRAVENOUS | Status: DC | PRN
  Administered 2016-03-14: 50 ug/kg/min via INTRAVENOUS

## 2016-03-14 MED ORDER — KETOROLAC TROMETHAMINE 30 MG/ML IJ SOLN
INTRAMUSCULAR | Status: DC | PRN
  Administered 2016-03-14: 30 mg

## 2016-03-14 MED ORDER — BUPIVACAINE HCL (PF) 0.5 % IJ SOLN
INTRAMUSCULAR | Status: DC | PRN
  Administered 2016-03-14: 3 mL via INTRATHECAL

## 2016-03-14 MED ORDER — HYDROMORPHONE HCL 1 MG/ML IJ SOLN: 0.5000 mg | INTRAMUSCULAR | Status: DC | PRN

## 2016-03-14 MED ORDER — BUPIVACAINE HCL 0.25 % IJ SOLN
INTRAMUSCULAR | Status: DC | PRN
  Administered 2016-03-14: 30 mL

## 2016-03-14 MED ORDER — ONDANSETRON HCL 4 MG/2ML IJ SOLN: 4.0000 mg | Freq: Four times a day (QID) | INTRAMUSCULAR | Status: DC | PRN

## 2016-03-14 MED ORDER — MIDAZOLAM HCL 2 MG/2ML IJ SOLN
INTRAMUSCULAR | Status: AC
  Filled 2016-03-14: qty 2

## 2016-03-14 MED ORDER — PROPOFOL 10 MG/ML IV BOLUS
INTRAVENOUS | Status: DC | PRN
  Administered 2016-03-14 (×3): 10 mg via INTRAVENOUS

## 2016-03-14 MED ORDER — PHENYLEPHRINE 40 MCG/ML (10ML) SYRINGE FOR IV PUSH (FOR BLOOD PRESSURE SUPPORT)
PREFILLED_SYRINGE | INTRAVENOUS | Status: AC
  Filled 2016-03-14: qty 10

## 2016-03-14 MED ORDER — KETOROLAC TROMETHAMINE 30 MG/ML IJ SOLN
INTRAMUSCULAR | Status: AC
  Filled 2016-03-14: qty 1

## 2016-03-14 MED ORDER — CHLORHEXIDINE GLUCONATE 4 % EX LIQD: 60.0000 mL | Freq: Once | CUTANEOUS | Status: DC

## 2016-03-14 MED ORDER — ISOPROPYL ALCOHOL 70 % SOLN
Status: AC
  Filled 2016-03-14: qty 480

## 2016-03-14 MED ORDER — POLYETHYLENE GLYCOL 3350 17 G PO PACK: 17.0000 g | PACK | Freq: Every day | ORAL | Status: DC | PRN

## 2016-03-14 MED ORDER — APIXABAN 2.5 MG PO TABS
2.5000 mg | ORAL_TABLET | Freq: Two times a day (BID) | ORAL | Status: DC
  Administered 2016-03-15: 10:00:00 2.5 mg via ORAL
  Filled 2016-03-14: qty 1

## 2016-03-14 MED ORDER — SODIUM CHLORIDE 0.9 % IJ SOLN
INTRAMUSCULAR | Status: AC
  Filled 2016-03-14: qty 50

## 2016-03-14 MED ORDER — ACETAMINOPHEN 10 MG/ML IV SOLN
1000.0000 mg | INTRAVENOUS | Status: AC
  Administered 2016-03-14: 1000 mg via INTRAVENOUS

## 2016-03-14 MED ORDER — PHENOL 1.4 % MT LIQD: 1.0000 | OROMUCOSAL | Status: DC | PRN

## 2016-03-14 MED ORDER — ONDANSETRON HCL 4 MG/2ML IJ SOLN
4.0000 mg | Freq: Four times a day (QID) | INTRAMUSCULAR | Status: DC | PRN
Start: 2016-03-14 — End: 2016-03-14

## 2016-03-14 MED ORDER — CEFAZOLIN SODIUM-DEXTROSE 2-4 GM/100ML-% IV SOLN
INTRAVENOUS | Status: AC
  Filled 2016-03-14: qty 100

## 2016-03-14 MED ORDER — SODIUM CHLORIDE 0.9 % IR SOLN
Status: DC | PRN
  Administered 2016-03-14 (×2): 1000 mL

## 2016-03-14 MED ORDER — HYDROCODONE-ACETAMINOPHEN 5-325 MG PO TABS
1.0000 | ORAL_TABLET | ORAL | Status: DC | PRN
  Administered 2016-03-14 (×2): 1 via ORAL
  Administered 2016-03-15 (×2): 2 via ORAL
  Administered 2016-03-15 (×2): 1 via ORAL
  Filled 2016-03-14: qty 1
  Filled 2016-03-14 (×2): qty 2
  Filled 2016-03-14 (×2): qty 1

## 2016-03-14 MED ORDER — OXYCODONE HCL 5 MG/5ML PO SOLN
5.0000 mg | Freq: Once | ORAL | Status: DC | PRN
  Filled 2016-03-14: qty 5

## 2016-03-14 MED ORDER — DEXAMETHASONE SODIUM PHOSPHATE 10 MG/ML IJ SOLN
INTRAMUSCULAR | Status: AC
  Filled 2016-03-14: qty 1

## 2016-03-14 MED ORDER — DOCUSATE SODIUM 100 MG PO CAPS
100.0000 mg | ORAL_CAPSULE | Freq: Two times a day (BID) | ORAL | Status: DC
  Administered 2016-03-14 – 2016-03-15 (×2): 100 mg via ORAL
  Filled 2016-03-14 (×2): qty 1

## 2016-03-14 MED ORDER — POVIDONE-IODINE 10 % EX SWAB: 2.0000 "application " | Freq: Once | CUTANEOUS | Status: DC

## 2016-03-14 MED ORDER — FENTANYL CITRATE (PF) 100 MCG/2ML IJ SOLN
INTRAMUSCULAR | Status: AC
  Filled 2016-03-14: qty 2

## 2016-03-14 MED ORDER — METHOCARBAMOL 1000 MG/10ML IJ SOLN
500.0000 mg | Freq: Four times a day (QID) | INTRAVENOUS | Status: DC | PRN
  Administered 2016-03-14: 500 mg via INTRAVENOUS
  Filled 2016-03-14: qty 550
  Filled 2016-03-14: qty 5

## 2016-03-14 MED ORDER — METOCLOPRAMIDE HCL 5 MG/ML IJ SOLN: 5.0000 mg | Freq: Three times a day (TID) | INTRAMUSCULAR | Status: DC | PRN

## 2016-03-14 MED ORDER — METOCLOPRAMIDE HCL 5 MG PO TABS: 5.0000 mg | ORAL_TABLET | Freq: Three times a day (TID) | ORAL | Status: DC | PRN

## 2016-03-14 MED ORDER — BUPIVACAINE HCL (PF) 0.25 % IJ SOLN
INTRAMUSCULAR | Status: AC
  Filled 2016-03-14: qty 30

## 2016-03-14 MED ORDER — TRANEXAMIC ACID 1000 MG/10ML IV SOLN
1000.0000 mg | INTRAVENOUS | Status: AC
  Administered 2016-03-14: 1000 mg via INTRAVENOUS
  Filled 2016-03-14: qty 1100

## 2016-03-14 MED ORDER — FENTANYL CITRATE (PF) 100 MCG/2ML IJ SOLN
25.0000 ug | INTRAMUSCULAR | Status: DC | PRN
  Administered 2016-03-14 (×2): 50 ug via INTRAVENOUS

## 2016-03-14 MED ORDER — ACETAMINOPHEN 10 MG/ML IV SOLN
INTRAVENOUS | Status: AC
  Filled 2016-03-14: qty 100

## 2016-03-14 MED ORDER — 0.9 % SODIUM CHLORIDE (POUR BTL) OPTIME
TOPICAL | Status: DC | PRN
  Administered 2016-03-14: 1000 mL

## 2016-03-14 MED ORDER — CEFAZOLIN SODIUM-DEXTROSE 2-4 GM/100ML-% IV SOLN
2.0000 g | INTRAVENOUS | Status: AC
  Administered 2016-03-14: 2 g via INTRAVENOUS

## 2016-03-14 MED ORDER — FENTANYL CITRATE (PF) 100 MCG/2ML IJ SOLN
INTRAMUSCULAR | Status: AC
  Administered 2016-03-14: 50 ug via INTRAVENOUS
  Filled 2016-03-14: qty 2

## 2016-03-14 MED ORDER — DEXAMETHASONE SODIUM PHOSPHATE 10 MG/ML IJ SOLN
10.0000 mg | Freq: Once | INTRAMUSCULAR | Status: AC
  Administered 2016-03-15: 10:00:00 10 mg via INTRAVENOUS
  Filled 2016-03-14: qty 1

## 2016-03-14 MED ORDER — HYDROCODONE-ACETAMINOPHEN 5-325 MG PO TABS
ORAL_TABLET | ORAL | Status: AC
  Administered 2016-03-14: 1 via ORAL
  Filled 2016-03-14: qty 1

## 2016-03-14 MED ORDER — ONDANSETRON HCL 4 MG PO TABS: 4.0000 mg | ORAL_TABLET | Freq: Four times a day (QID) | ORAL | Status: DC | PRN

## 2016-03-14 MED ORDER — SENNA 8.6 MG PO TABS
2.0000 | ORAL_TABLET | Freq: Every day | ORAL | Status: DC
  Administered 2016-03-14: 22:00:00 17.2 mg via ORAL
  Filled 2016-03-14: qty 2

## 2016-03-14 MED ORDER — DEXAMETHASONE SODIUM PHOSPHATE 10 MG/ML IJ SOLN
INTRAMUSCULAR | Status: DC | PRN
  Administered 2016-03-14: 10 mg via INTRAVENOUS

## 2016-03-14 SURGICAL SUPPLY — 45 items
BAG DECANTER FOR FLEXI CONT (MISCELLANEOUS) IMPLANT
BAG ZIPLOCK 12X15 (MISCELLANEOUS) IMPLANT
CAPT HIP TOTAL 2 ×3 IMPLANT
CHLORAPREP W/TINT 26ML (MISCELLANEOUS) ×3 IMPLANT
CLOTH BEACON ORANGE TIMEOUT ST (SAFETY) ×3 IMPLANT
COVER PERINEAL POST (MISCELLANEOUS) ×3 IMPLANT
DECANTER SPIKE VIAL GLASS SM (MISCELLANEOUS) ×3 IMPLANT
DERMABOND ADVANCED (GAUZE/BANDAGES/DRESSINGS) ×2
DERMABOND ADVANCED .7 DNX12 (GAUZE/BANDAGES/DRESSINGS) ×1 IMPLANT
DRAPE SHEET LG 3/4 BI-LAMINATE (DRAPES) ×6 IMPLANT
DRAPE STERI IOBAN 125X83 (DRAPES) ×3 IMPLANT
DRAPE U-SHAPE 47X51 STRL (DRAPES) ×6 IMPLANT
DRSG AQUACEL AG ADV 3.5X10 (GAUZE/BANDAGES/DRESSINGS) ×3 IMPLANT
ELECT PENCIL ROCKER SW 15FT (MISCELLANEOUS) ×3 IMPLANT
ELECT REM PT RETURN 15FT ADLT (MISCELLANEOUS) ×3 IMPLANT
GAUZE SPONGE 4X4 12PLY STRL (GAUZE/BANDAGES/DRESSINGS) ×3 IMPLANT
GLOVE BIO SURGEON STRL SZ8.5 (GLOVE) ×6 IMPLANT
GLOVE BIOGEL PI IND STRL 7.5 (GLOVE) ×4 IMPLANT
GLOVE BIOGEL PI IND STRL 8.5 (GLOVE) ×1 IMPLANT
GLOVE BIOGEL PI INDICATOR 7.5 (GLOVE) ×8
GLOVE BIOGEL PI INDICATOR 8.5 (GLOVE) ×2
GOWN SPEC L3 XXLG W/TWL (GOWN DISPOSABLE) ×3 IMPLANT
GOWN STRL REUS W/ TWL XL LVL3 (GOWN DISPOSABLE) ×2 IMPLANT
GOWN STRL REUS W/TWL XL LVL3 (GOWN DISPOSABLE) ×4
HANDPIECE INTERPULSE COAX TIP (DISPOSABLE) ×2
HOLDER FOLEY CATH W/STRAP (MISCELLANEOUS) ×3 IMPLANT
HOOD PEEL AWAY FLYTE STAYCOOL (MISCELLANEOUS) ×6 IMPLANT
MARKER SKIN DUAL TIP RULER LAB (MISCELLANEOUS) ×3 IMPLANT
NEEDLE SPNL 18GX3.5 QUINCKE PK (NEEDLE) ×3 IMPLANT
PACK ANTERIOR HIP CUSTOM (KITS) ×3 IMPLANT
SAW OSC TIP CART 19.5X105X1.3 (SAW) ×3 IMPLANT
SEALER BIPOLAR AQUA 6.0 (INSTRUMENTS) ×3 IMPLANT
SET HNDPC FAN SPRY TIP SCT (DISPOSABLE) ×1 IMPLANT
SUT ETHIBOND NAB CT1 #1 30IN (SUTURE) ×6 IMPLANT
SUT MNCRL AB 3-0 PS2 18 (SUTURE) ×3 IMPLANT
SUT MON AB 2-0 CT1 36 (SUTURE) ×6 IMPLANT
SUT STRATAFIX PDO 1 14 VIOLET (SUTURE) ×2
SUT STRATFX PDO 1 14 VIOLET (SUTURE) ×1
SUT VIC AB 2-0 CT1 27 (SUTURE) ×2
SUT VIC AB 2-0 CT1 TAPERPNT 27 (SUTURE) ×1 IMPLANT
SUTURE STRATFX PDO 1 14 VIOLET (SUTURE) ×1 IMPLANT
SYR 50ML LL SCALE MARK (SYRINGE) ×3 IMPLANT
TRAY FOLEY W/METER SILVER 16FR (SET/KITS/TRAYS/PACK) IMPLANT
WATER STERILE IRR 1500ML POUR (IV SOLUTION) ×6 IMPLANT
YANKAUER SUCT BULB TIP 10FT TU (MISCELLANEOUS) ×3 IMPLANT

## 2016-03-14 NOTE — Op Note (Signed)
OPERATIVE REPORT  SURGEON: Rod Can, MD   ASSISTANT: Staff.  PREOPERATIVE DIAGNOSIS: Left hip arthritis.   POSTOPERATIVE DIAGNOSIS: Left hip arthritis.   PROCEDURE: Left total hip arthroplasty, anterior approach.   IMPLANTS: DePuy Tri Lock stem, size 8, hi offset. DePuy Pinnacle Cup, size 60 mm. DePuy Altrx liner, size 36 by 60 mm,  neutral. DePuy Biolox ceramic head ball, size 36 + 5 mm.  ANESTHESIA:  Spinal  ESTIMATED BLOOD LOSS: 650 mL.  ANTIBIOTICS: 2 g Ancef.  DRAINS: None.  COMPLICATIONS: None.   CONDITION: PACU - hemodynamically stable.   BRIEF CLINICAL NOTE: Theodore Hutchinson is a 71 y.o. male with a long-standing history of Left hip arthritis. After failing conservative management, the patient was indicated for total hip arthroplasty. The risks, benefits, and alternatives to the procedure were explained, and the patient elected to proceed.  PROCEDURE IN DETAIL: Surgical site was marked by myself in the pre-op holding area. Once inside the operative room, spinal anesthesia was obtained, and a foley catheter was inserted. The patient was then positioned on the [place] table. All bony prominences were well padded. The hip was prepped and draped in the normal sterile surgical fashion. A time-out was called verifying side and site of surgery. The patient received IV antibiotics within 60 minutes of beginning the procedure.  The direct anterior approach to the hip was performed through the Hueter interval. Lateral femoral circumflex vessels were treated with the Auqumantys. The anterior capsule was exposed and an inverted T capsulotomy was made.The femoral neck cut was made to the level of the templated cut. A corkscrew was placed into the head and the head was removed. The femoral head was found to have eburnated bone. The head was passed to the back table and was measured.  Acetabular exposure was achieved, and the pulvinar and labrum were excised. Sequental  reaming of the acetabulum was then performed up to a size 59 mm reamer. A 60 mm cup was then opened and impacted into place at approximately 40 degrees of abduction and 20 degrees of anteversion. The final polyethylene liner was impacted into place and acetabular osteophytes were removed.   I then gained femoral exposure taking care to protect the abductors and greater trochanter. This was performed using standard external rotation, extension, and adduction. The capsule was peeled off the inner aspect of the greater trochanter, taking care to preserve the short external rotators. A cookie cutter was used to enter the femoral canal, and then the femoral canal finder was placed. Sequential broaching was performed up to a size 8. Calcar planer was used on the femoral neck remnant. I placed a hi offset neck and a trial head ball. The hip was reduced. Leg lengths and offset were checked fluoroscopically. The hip was dislocated and trial components were removed. The final implants were placed, and the hip was reduced.  Fluoroscopy was used to confirm component position and leg lengths. At 90 degrees of external rotation and full extension, the hip was stable to an anterior directed force.  The wound was copiously irrigated with a dilute betadine solution followed by normal saline. Marcaine solution was injected into the periarticular soft tissue. The wound was closed in layers using #1 Vicryl and V-Loc for the fascia, 2-0 Vicryl for the subcutaneous fat, 2-0 Monocryl for the deep dermal layer, 3-0 running Monocryl subcuticular stitch, and Dermabond for the skin. Once the glue was fully dried, an Aquacell Ag dressing was applied. The patient was transported to the recovery  room in stable condition. Sponge, needle, and instrument counts were correct at the end of the case x2. The patient tolerated the procedure well and there were no known complications.

## 2016-03-14 NOTE — Anesthesia Preprocedure Evaluation (Signed)
Anesthesia Evaluation  Patient identified by MRN, date of birth, ID band Patient awake    Reviewed: Allergy & Precautions, H&P , NPO status , Patient's Chart, lab work & pertinent test results  Airway Mallampati: II   Neck ROM: full    Dental   Pulmonary former smoker,    breath sounds clear to auscultation       Cardiovascular negative cardio ROS   Rhythm:regular Rate:Normal     Neuro/Psych    GI/Hepatic   Endo/Other    Renal/GU      Musculoskeletal  (+) Arthritis ,   Abdominal   Peds  Hematology   Anesthesia Other Findings   Reproductive/Obstetrics                             Anesthesia Physical Anesthesia Plan  ASA: II  Anesthesia Plan: Spinal   Post-op Pain Management:    Induction: Intravenous  Airway Management Planned: Simple Face Mask  Additional Equipment:   Intra-op Plan:   Post-operative Plan:   Informed Consent: I have reviewed the patients History and Physical, chart, labs and discussed the procedure including the risks, benefits and alternatives for the proposed anesthesia with the patient or authorized representative who has indicated his/her understanding and acceptance.     Plan Discussed with: CRNA, Anesthesiologist and Surgeon  Anesthesia Plan Comments:         Anesthesia Quick Evaluation

## 2016-03-14 NOTE — Anesthesia Procedure Notes (Signed)
Spinal  Patient location during procedure: OR Start time: 03/14/2016 1:26 PM End time: 03/14/2016 1:26 PM Staffing Resident/CRNA: Darlys Gales R Performed: resident/CRNA  Preanesthetic Checklist Completed: patient identified, site marked, surgical consent, pre-op evaluation, timeout performed, IV checked, risks and benefits discussed and monitors and equipment checked Spinal Block Patient position: sitting Prep: DuraPrep Patient monitoring: heart rate, continuous pulse ox and blood pressure Approach: midline Location: L3-4 Injection technique: single-shot Needle Needle type: Spinocan  Needle gauge: 22 G Needle length: 9 cm Needle insertion depth: 7 cm Assessment Sensory level: T6

## 2016-03-14 NOTE — Anesthesia Postprocedure Evaluation (Signed)
Anesthesia Post Note  Patient: Theodore Hutchinson  Procedure(s) Performed: Procedure(s) (LRB): LEFT TOTAL HIP ARTHROPLASTY ANTERIOR APPROACH (Left)  Patient location during evaluation: PACU Anesthesia Type: Spinal Level of consciousness: oriented and awake and alert Pain management: pain level controlled Vital Signs Assessment: post-procedure vital signs reviewed and stable Respiratory status: spontaneous breathing, respiratory function stable and patient connected to nasal cannula oxygen Cardiovascular status: blood pressure returned to baseline and stable Postop Assessment: no headache and no backache Anesthetic complications: no       Last Vitals:  Vitals:   03/14/16 1600 03/14/16 1604  BP: (!) 80/48 94/61  Pulse: 61   Resp: 15   Temp: 36.7 C     Last Pain:  Vitals:   03/14/16 1600  TempSrc:   PainSc: 0-No pain                 Anzel Kearse S

## 2016-03-14 NOTE — Transfer of Care (Signed)
Immediate Anesthesia Transfer of Care Note  Patient: Theodore Hutchinson  Procedure(s) Performed: Procedure(s): LEFT TOTAL HIP ARTHROPLASTY ANTERIOR APPROACH (Left)  Patient Location: PACU  Anesthesia Type:Spinal  Level of Consciousness:  sedated, patient cooperative and responds to stimulation  Airway & Oxygen Therapy:Patient Spontanous Breathing and Patient connected to face mask oxgen  Post-op Assessment:  Report given to PACU RN and Post -op Vital signs reviewed and stable  Post vital signs:  Reviewed and stable  Last Vitals:  Vitals:   03/14/16 0953  BP: (!) 141/70  Pulse: 70  Resp: 18  Temp: [id number] C    Complications: No apparent anesthesia complications

## 2016-03-14 NOTE — Interval H&P Note (Signed)
History and Physical Interval Note:  03/14/2016 1:08 PM  Theodore Hutchinson  has presented today for surgery, with the diagnosis of Degenerative joint disease left hip  The various methods of treatment have been discussed with the patient and family. After consideration of risks, benefits and other options for treatment, the patient has consented to  Procedure(s): LEFT TOTAL HIP ARTHROPLASTY ANTERIOR APPROACH (Left) as a surgical intervention .  The patient's history has been reviewed, patient examined, no change in status, stable for surgery.  I have reviewed the patient's chart and labs.  Questions were answered to the patient's satisfaction.     Stephen Turnbaugh, Horald Pollen

## 2016-03-14 NOTE — H&P (View-Only) (Signed)
TOTAL HIP ADMISSION H&P  Patient is admitted for left total hip arthroplasty.  Subjective:  Chief Complaint: left hip pain  HPI: Theodore Hutchinson, 71 y.o. male, has a history of pain and functional disability in the left hip(s) due to arthritis and patient has failed non-surgical conservative treatments for greater than 12 weeks to include NSAID's and/or analgesics, flexibility and strengthening excercises, use of assistive devices and activity modification.  Onset of symptoms was gradual starting 5 years ago with rapidlly worsening course since that time.The patient noted no past surgery on the left hip(s).  Patient currently rates pain in the left hip at 10 out of 10 with activity. Patient has night pain, worsening of pain with activity and weight bearing, pain that interfers with activities of daily living, pain with passive range of motion and crepitus. Patient has evidence of subchondral cysts, subchondral sclerosis, periarticular osteophytes and joint space narrowing by imaging studies. This condition presents safety issues increasing the risk of falls. There is no current active infection.  There are no active problems to display for this patient.  No past medical history on file.  No past surgical history on file.   (Not in a hospital admission) No Known Allergies  Social History  Substance Use Topics  . Smoking status: Not on file  . Smokeless tobacco: Not on file  . Alcohol use Not on file    No family history on file.   Review of Systems  Constitutional: Negative.   HENT: Positive for hearing loss.   Eyes: Negative.   Respiratory: Negative.   Cardiovascular: Negative.   Gastrointestinal: Negative.   Genitourinary: Negative.   Musculoskeletal: Positive for joint pain and myalgias.  Skin: Negative.   Neurological: Negative.   Endo/Heme/Allergies: Negative.   Psychiatric/Behavioral: Negative.     Objective:  Physical Exam  Vitals reviewed. Constitutional: He is  oriented to person, place, and time. He appears well-developed and well-nourished.  HENT:  Head: Normocephalic and atraumatic.  Eyes: Conjunctivae and EOM are normal. Pupils are equal, round, and reactive to light.  Neck: Normal range of motion. Neck supple.  Cardiovascular: Normal rate, regular rhythm and intact distal pulses.   Respiratory: Effort normal. No respiratory distress.  GI: Soft. He exhibits no distension.  Genitourinary:  Genitourinary Comments: deferred  Musculoskeletal:       Left hip: He exhibits decreased range of motion, tenderness and crepitus.  Neurological: He is alert and oriented to person, place, and time. He has normal reflexes.  Skin: Skin is warm and dry.  Psychiatric: He has a normal mood and affect. His behavior is normal. Judgment and thought content normal.    Vital signs in last 24 hours: @VSRANGES @  Labs:   There is no height or weight on file to calculate BMI.   Imaging Review Plain radiographs demonstrate severe degenerative joint disease of the left hip(s). The bone quality appears to be adequate for age and reported activity level.  Assessment/Plan:  End stage arthritis, left hip(s)  The patient history, physical examination, clinical judgement of the provider and imaging studies are consistent with end stage degenerative joint disease of the left hip(s) and total hip arthroplasty is deemed medically necessary. The treatment options including medical management, injection therapy, arthroscopy and arthroplasty were discussed at length. The risks and benefits of total hip arthroplasty were presented and reviewed. The risks due to aseptic loosening, infection, stiffness, dislocation/subluxation,  thromboembolic complications and other imponderables were discussed.  The patient acknowledged the explanation, agreed to proceed  with the plan and consent was signed. Patient is being admitted for inpatient treatment for surgery, pain control, PT, OT,  prophylactic antibiotics, VTE prophylaxis, progressive ambulation and ADL's and discharge planning.The patient is planning to be discharged home with home health services

## 2016-03-15 ENCOUNTER — Encounter (HOSPITAL_COMMUNITY): Payer: Self-pay | Admitting: Orthopedic Surgery

## 2016-03-15 LAB — CBC
HCT: 23.9 % — ABNORMAL LOW (ref 39.0–52.0)
Hemoglobin: 8.3 g/dL — ABNORMAL LOW (ref 13.0–17.0)
MCH: 30.7 pg (ref 26.0–34.0)
MCHC: 34.7 g/dL (ref 30.0–36.0)
MCV: 88.5 fL (ref 78.0–100.0)
Platelets: 152 10*3/uL (ref 150–400)
RBC: 2.7 MIL/uL — ABNORMAL LOW (ref 4.22–5.81)
RDW: 13.7 % (ref 11.5–15.5)
WBC: 10.3 10*3/uL (ref 4.0–10.5)

## 2016-03-15 LAB — BASIC METABOLIC PANEL
Anion gap: 5 (ref 5–15)
BUN: 22 mg/dL — AB (ref 6–20)
CALCIUM: 8.6 mg/dL — AB (ref 8.9–10.3)
CO2: 26 mmol/L (ref 22–32)
Chloride: 107 mmol/L (ref 101–111)
Creatinine, Ser: 1.03 mg/dL (ref 0.61–1.24)
GFR calc non Af Amer: >60 mL/min (ref >60)
Glucose, Bld: 182 mg/dL — ABNORMAL HIGH (ref 65–99)
Potassium: 4.6 mmol/L (ref 3.5–5.1)
SODIUM: 138 mmol/L (ref 135–145)

## 2016-03-15 MED ORDER — METHOCARBAMOL 500 MG PO TABS: 500.0000 mg | ORAL_TABLET | Freq: Four times a day (QID) | ORAL | 0 refills | Status: DC | PRN

## 2016-03-15 MED ORDER — HYDROCODONE-ACETAMINOPHEN 5-325 MG PO TABS: 1.0000 | ORAL_TABLET | ORAL | 0 refills | Status: DC | PRN

## 2016-03-15 MED ORDER — APIXABAN 2.5 MG PO TABS: 2.5000 mg | ORAL_TABLET | Freq: Two times a day (BID) | ORAL | 1 refills | Status: DC

## 2016-03-15 MED ORDER — ONDANSETRON HCL 4 MG PO TABS: 4.0000 mg | ORAL_TABLET | Freq: Four times a day (QID) | ORAL | 0 refills | Status: DC | PRN

## 2016-03-15 MED ORDER — DOCUSATE SODIUM 100 MG PO CAPS: 100.0000 mg | ORAL_CAPSULE | Freq: Two times a day (BID) | ORAL | 0 refills | Status: DC

## 2016-03-15 MED ORDER — SENNA 8.6 MG PO TABS: 2.0000 | ORAL_TABLET | Freq: Every day | ORAL | 0 refills | Status: DC

## 2016-03-15 NOTE — Progress Notes (Signed)
   Subjective:  Patient reports pain as mild to moderate.  Denies N/V/CP/SOB.  Objective:   VITALS:   Vitals:   03/14/16 2052 03/14/16 2130 03/15/16 0222 03/15/16 0512  BP: (!) 92/59 105/86 (!) 95/51 (!) 106/48  Pulse:  73 70 71  Resp:  16 16 16   Temp:  98.4 F (36.9 C) 98.8 F (37.1 C) 98.4 F (36.9 C)  TempSrc:  Oral Oral Oral  SpO2:  97% 96% 97%  Weight:      Height:         ABD soft Sensation intact distally Intact pulses distally Dorsiflexion/Plantar flexion intact Incision: dressing C/D/I Compartment soft   Lab Results  Component Value Date   WBC 10.3 03/15/2016   HGB 8.3 (L) 03/15/2016   HCT 23.9 (L) 03/15/2016   MCV 88.5 03/15/2016   PLT 152 03/15/2016   BMET    Component Value Date/Time   NA 138 03/15/2016 0420   K 4.6 03/15/2016 0420   CL 107 03/15/2016 0420   CO2 26 03/15/2016 0420   GLUCOSE 182 (H) 03/15/2016 0420   BUN 22 (H) 03/15/2016 0420   CREATININE 1.03 03/15/2016 0420   CALCIUM 8.6 (L) 03/15/2016 0420   GFRNONAA >60 03/15/2016 0420   GFRAA >60 03/15/2016 0420     Assessment/Plan: 1 Day Post-Op   Principal Problem:   Primary osteoarthritis of left hip   WBAT with walker DVT ppx: eliquis, SCDs, TEDs PO pain control PT/OT Dispo: D/ C home after clears therapy and voids, today vs tomorrow   Elie Goody 03/15/2016, 7:40 AM   Rod Can, MD Cell [phone]

## 2016-03-15 NOTE — Discharge Instructions (Signed)
°Dr. Brian Swinteck °Joint Replacement Specialist °[facility] °[address] °[place],  [address] °[phone] ° ° °TOTAL HIP REPLACEMENT POSTOPERATIVE DIRECTIONS ° ° ° °Hip Rehabilitation, Guidelines Following Surgery  ° °WEIGHT BEARING °Weight bearing as tolerated with assist device (walker, cane, etc) as directed, use it as long as suggested by your surgeon or therapist, typically at least 4-6 weeks. ° °The results of a hip operation are greatly improved after range of motion and muscle strengthening exercises. Follow all safety measures which are given to protect your hip. If any of these exercises cause increased pain or swelling in your joint, decrease the amount until you are comfortable again. Then slowly increase the exercises. Call your caregiver if you have problems or questions.  ° °HOME CARE INSTRUCTIONS  °Most of the following instructions are designed to prevent the dislocation of your new hip.  °Remove items at home which could result in a fall. This includes throw rugs or furniture in walking pathways.  °Continue medications as instructed at time of discharge. °· You may have some home medications which will be placed on hold until you complete the course of blood thinner medication. °· You may start showering once you are discharged home. Do not remove your dressing. °Do not put on socks or shoes without following the instructions of your caregivers.   °Sit on chairs with arms. Use the chair arms to help push yourself up when arising.  °Arrange for the use of a toilet seat elevator so you are not sitting low.  °· Walk with walker as instructed.  °You may resume a sexual relationship in one month or when given the OK by your caregiver.  °Use walker as long as suggested by your caregivers.  °You may put full weight on your legs and walk as much as is comfortable. °Avoid periods of inactivity such as sitting longer than an hour when not asleep. This helps prevent  blood clots.  °You may return to work once you are cleared by your surgeon.  °Do not drive a car for 6 weeks or until released by your surgeon.  °Do not drive while taking narcotics.  °Wear elastic stockings for two weeks following surgery during the day but you may remove then at night.  °Make sure you keep all of your appointments after your operation with all of your doctors and caregivers. You should call the office at the above phone number and make an appointment for approximately two weeks after the date of your surgery. °Please pick up a stool softener and laxative for home use as long as you are requiring pain medications. °· ICE to the affected hip every three hours for 30 minutes at a time and then as needed for pain and swelling. Continue to use ice on the hip for pain and swelling from surgery. You may notice swelling that will progress down to the foot and ankle.  This is normal after surgery.  Elevate the leg when you are not up walking on it.   °It is important for you to complete the blood thinner medication as prescribed by your doctor. °· Continue to use the breathing machine which will help keep your temperature down.  It is common for your temperature to cycle up and down following surgery, especially at night when you are not up moving around and exerting yourself.  The breathing machine keeps your lungs expanded and your temperature down. ° °RANGE OF MOTION AND STRENGTHENING EXERCISES  °These exercises are   designed to help you keep full movement of your hip joint. Follow your caregiver's or physical therapist's instructions. Perform all exercises about fifteen times, three times per day or as directed. Exercise both hips, even if you have had only one joint replacement. These exercises can be done on a training (exercise) mat, on the floor, on a table or on a bed. Use whatever works the best and is most comfortable for you. Use music or television while you are exercising so that the exercises  are a pleasant break in your day. This will make your life better with the exercises acting as a break in routine you can look forward to.  Lying on your back, slowly slide your foot toward your buttocks, raising your knee up off the floor. Then slowly slide your foot back down until your leg is straight again.  Lying on your back spread your legs as far apart as you can without causing discomfort.  Lying on your side, raise your upper leg and foot straight up from the floor as far as is comfortable. Slowly lower the leg and repeat.  Lying on your back, tighten up the muscle in the front of your thigh (quadriceps muscles). You can do this by keeping your leg straight and trying to raise your heel off the floor. This helps strengthen the largest muscle supporting your knee.  Lying on your back, tighten up the muscles of your buttocks both with the legs straight and with the knee bent at a comfortable angle while keeping your heel on the floor.   SKILLED REHAB INSTRUCTIONS: If the patient is transferred to a skilled rehab facility following release from the hospital, a list of the current medications will be sent to the facility for the patient to continue.  When discharged from the skilled rehab facility, please have the facility set up the patient's [place] prior to being released. Also, the skilled facility will be responsible for providing the patient with their medications at time of release from the facility to include their pain medication and their blood thinner medication. If the patient is still at the rehab facility at time of the two week follow up appointment, the skilled rehab facility will also need to assist the patient in arranging follow up appointment in our office and any transportation needs.  MAKE SURE YOU:  Understand these instructions.  Will watch your condition.  Will get help right away if you are not doing well or get worse.  Pick up stool softner and  laxative for home use following surgery while on pain medications. Do not remove your dressing. The dressing is waterproof--it is OK to take showers. Continue to use ice for pain and swelling after surgery. Do not use any lotions or creams on the incision until instructed by your surgeon. Total Hip Protocol.   Information on my medicine - ELIQUIS (apixaban)  This medication education was reviewed with me or my healthcare representative as part of my discharge preparation.  The pharmacist that spoke with me during my hospital stay was:  Modena Morrow, PharmD Candidate  Why was Eliquis prescribed for you? Eliquis was prescribed for you to reduce the risk of blood clots forming after orthopedic surgery.    What do You need to know about Eliquis? Take your Eliquis TWICE DAILY - one tablet in the morning and one tablet in the evening with or without food.  It would be best to take the dose about the same time each  day.  If you have difficulty swallowing the tablet whole please discuss with your pharmacist how to take the medication safely.  Take Eliquis exactly as prescribed by your doctor and DO NOT stop taking Eliquis without talking to the doctor who prescribed the medication.  Stopping without other medication to take the place of Eliquis may increase your risk of developing a clot.  After discharge, you should have regular check-up appointments with your healthcare provider that is prescribing your Eliquis.  What do you do if you miss a dose? If a dose of ELIQUIS is not taken at the scheduled time, take it as soon as possible on the same day and twice-daily administration should be resumed.  The dose should not be doubled to make up for a missed dose.  Do not take more than one tablet of ELIQUIS at the same time.  Important Safety Information A possible side effect of Eliquis is bleeding. You should call your healthcare provider right away if you experience any of the  following: ? Bleeding from an injury or your nose that does not stop. ? Unusual colored urine (red or dark brown) or unusual colored stools (red or black). ? Unusual bruising for unknown reasons. ? A serious fall or if you hit your head (even if there is no bleeding).  Some medicines may interact with Eliquis and might increase your risk of bleeding or clotting while on Eliquis. To help avoid this, consult your healthcare provider or pharmacist prior to using any new prescription or non-prescription medications, including herbals, vitamins, non-steroidal anti-inflammatory drugs (NSAIDs) and supplements.  This website has more information on Eliquis (apixaban): http://www.eliquis.com/eliquis/home

## 2016-03-15 NOTE — Discharge Summary (Signed)
Physician Discharge Summary  Patient ID: [name] MRN: [id number] DOB/AGE: May 11, 1945 71 y.o.  Admit date: 03/14/2016 Discharge date: 03/15/2016  Admission Diagnoses:  Primary osteoarthritis of left hip  Discharge Diagnoses:  Principal Problem:   Primary osteoarthritis of left hip   Past Medical History:  Diagnosis Date  . Arthritis    osteoarthritis- left hip-radiates pain left leg    Surgeries: Procedure(s): LEFT TOTAL HIP ARTHROPLASTY ANTERIOR APPROACH on 03/14/2016   Consultants (if any):   Discharged Condition: Improved  Hospital Course: Theodore Hutchinson is an 71 y.o. male who was admitted 03/14/2016 with a diagnosis of Primary osteoarthritis of left hip and went to the operating room on 03/14/2016 and underwent the above named procedures.    He was given perioperative antibiotics:  Anti-infectives    Start     Dose/Rate Route Frequency Ordered Stop   03/14/16 2000  ceFAZolin (ANCEF) IVPB 2g/100 mL premix     2 g 200 mL/hr over 30 Minutes Intravenous Every 6 hours 03/14/16 1845 03/15/16 0257   03/14/16 1002  ceFAZolin (ANCEF) IVPB 2g/100 mL premix     2 g 200 mL/hr over 30 Minutes Intravenous On call to O.R. 03/14/16 1002 03/14/16 1323    .  He was given sequential compression devices, early ambulation, and apixaban for DVT prophylaxis.  He benefited maximally from the hospital stay and there were no complications.    Recent vital signs:  Vitals:   03/15/16 0222 03/15/16 0512  BP: (!) 95/51 (!) 106/48  Pulse: 70 71  Resp: 16 16  Temp: 98.8 F (37.1 C) 98.4 F (36.9 C)    Recent laboratory studies:  Lab Results  Component Value Date   HGB 8.3 (L) 03/15/2016   HGB 12.3 (L) 03/07/2016   Lab Results  Component Value Date   WBC 10.3 03/15/2016   PLT 152 03/15/2016   No results found for: INR Lab Results  Component Value Date   NA 138 03/15/2016   K 4.6 03/15/2016   CL 107 03/15/2016   CO2 26 03/15/2016   BUN 22 (H) 03/15/2016   CREATININE  1.03 03/15/2016   GLUCOSE 182 (H) 03/15/2016    Discharge Medications:   Allergies as of 03/15/2016   No Known Allergies     Medication List    STOP taking these medications   aspirin EC 81 MG tablet   naproxen 500 MG tablet Commonly known as:  NAPROSYN   traMADol 50 MG tablet Commonly known as:  ULTRAM     TAKE these medications   apixaban 2.5 MG Tabs tablet Commonly known as:  ELIQUIS Take 1 tablet (2.5 mg total) by mouth every 12 (twelve) hours.   docusate sodium 100 MG capsule Commonly known as:  COLACE Take 1 capsule (100 mg total) by mouth 2 (two) times daily. What changed:  when to take this   FIBER PO Take 1 tablet by mouth daily.   FISH OIL CONCENTRATE 300 MG Caps Take 900 mg by mouth daily.   HYDROcodone-acetaminophen 5-325 MG tablet Commonly known as:  NORCO/VICODIN Take 1-2 tablets by mouth every 4 (four) hours as needed (breakthrough pain).   methocarbamol 500 MG tablet Commonly known as:  ROBAXIN Take 1 tablet (500 mg total) by mouth every 6 (six) hours as needed for muscle spasms.   multivitamin with minerals Tabs tablet Take 1 tablet by mouth daily.   ondansetron 4 MG tablet Commonly known as:  ZOFRAN Take 1 tablet (4 mg total) by mouth  every 6 (six) hours as needed for nausea.   senna 8.6 MG Tabs tablet Commonly known as:  SENOKOT Take 2 tablets (17.2 mg total) by mouth at bedtime.            Durable Medical Equipment        Start     Ordered   03/14/16 1845  DME Bedside commode  Once    Question:  Patient needs a bedside commode to treat with the following condition  Answer:  Status post total hip replacement, left   03/14/16 1845   03/14/16 1845  DME 3 n 1  Once     03/14/16 1845   03/14/16 1845  DME Walker rolling  Once    Question:  Patient needs a walker to treat with the following condition  Answer:  Status post total hip replacement, left   03/14/16 1845      Diagnostic Studies: Dg Pelvis Portable  Result Date:  03/14/2016 CLINICAL DATA:  Postop left hip replacement. EXAM: PORTABLE PELVIS 1-2 VIEWS COMPARISON:  01/19/2016 FINDINGS: Changes of left hip replacement. Normal AP alignment. No hardware or bony complicating feature. IMPRESSION: Left hip replacement.  No complicating feature. Electronically Signed   By: Theodore Hutchinson M.D.   On: 03/14/2016 16:52   Dg C-arm 61-120 Min-no Report  Result Date: 03/14/2016 There is no Radiologist interpretation  for this exam.  Dg Hip Operative Unilat With Pelvis Left  Result Date: 03/14/2016 CLINICAL DATA:  Left hip replacement EXAM: OPERATIVE left HIP (WITH PELVIS IF PERFORMED) 2 VIEWS TECHNIQUE: Fluoroscopic spot image(s) were submitted for interpretation post-operatively. COMPARISON:  01/19/2016 FINDINGS: Total fluoroscopy time was 38 seconds. Two low resolution spot intraoperative images of the left hip. Images demonstrate left hip replacement with satisfactory alignment. IMPRESSION: Intraoperative fluoroscopy provided during left hip replacement Electronically Signed   By: Theodore Hutchinson M.D.   On: 03/14/2016 15:48    Disposition: 01-Home or Self Care  Discharge Instructions    Call MD / Call 911    Complete by:  As directed    If you experience chest pain or shortness of breath, CALL 911 and be transported to the hospital emergency room.  If you develope a fever above 101 F, pus (white drainage) or increased drainage or redness at the wound, or calf pain, call your surgeon's office.   Constipation Prevention    Complete by:  As directed    Drink plenty of fluids.  Prune juice may be helpful.  You may use a stool softener, such as Colace (over the counter) 100 mg twice a day.  Use MiraLax (over the counter) for constipation as needed.   Diet - low sodium heart healthy    Complete by:  As directed    Driving restrictions    Complete by:  As directed    No driving for 4 weeks   Increase activity slowly as tolerated    Complete by:  As directed    Lifting  restrictions    Complete by:  As directed    No lifting for 4 weeks   TED hose    Complete by:  As directed    Use stockings (TED hose) for 2 weeks on both leg(s).  You may remove them at night for sleeping.      Follow-up Information    Mal Asher, Theodore Pollen, MD. Schedule an appointment as soon as possible for a visit in 2 weeks.   Specialty:  Orthopedic Surgery Why:  For wound re-check  Contact information: [address] [place] [address] [phone]            Signed: Elie Goody 03/15/2016, 4:31 PM

## 2016-03-15 NOTE — Evaluation (Signed)
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 He walked ~200 feet with a RW. Pain rated 3/10 with activity. Practiced gait training, stair training, and exercises. Issued HEP for pt to perform 2x/day. All education completed. Ready to d/c from PT standpoint.     Follow Up Recommendations No PT follow up;Supervision - Intermittent    Equipment Recommendations  Rolling walker with 5" wheels    Recommendations for Other Services OT consult     Precautions / Restrictions Precautions Precautions: Fall Restrictions Weight Bearing Restrictions: No LLE Weight Bearing: Weight bearing as tolerated      Mobility  Bed Mobility Overal bed mobility: Needs Assistance Bed Mobility: Supine to Sit     Supine to sit: Supervision     General bed mobility comments: oob in recliner  Transfers Overall transfer level: Needs assistance Equipment used: Rolling walker (2 wheeled) Transfers: Sit to/from Stand Sit to Stand: Supervision         General transfer comment: for safety  Ambulation/Gait Ambulation/Gait assistance: Supervision Ambulation Distance (Feet): 200 Feet Assistive device: Rolling walker (2 wheeled) Gait Pattern/deviations: Step-through pattern     General Gait Details: for safety. Cues for safe walker use  Stairs Stairs: Yes Stairs assistance: Min guard Stair Management: One rail Right;Step to pattern;Forwards Number of Stairs: 2 General stair comments: close guard for safety. VCs sequence.   Wheelchair Mobility    Modified Rankin (Stroke Patients Only)       Balance                                             Pertinent Vitals/Pain Pain Assessment: 0-10 Pain Score: 3  Pain Location: L hip/thigh Pain Descriptors /  Indicators: Sore Pain Intervention(s): Monitored during session;Repositioned;Ice applied    Home Living Family/patient expects to be discharged to:: Private residence Living Arrangements: Spouse/significant other Available Help at Discharge: Family   Home Access: Stairs to enter Entrance Stairs-Rails: Right Entrance Stairs-Number of Steps: 2 Home Layout: One level Home Equipment: None      Prior Function Level of Independence: Needs assistance         Comments: wife assisted with sock/shoe     Hand Dominance        Extremity/Trunk Assessment   Upper Extremity Assessment Upper Extremity Assessment: Defer to OT evaluation    Lower Extremity Assessment Lower Extremity Assessment: Generalized weakness (s/p L THA)    Cervical / Trunk Assessment Cervical / Trunk Assessment: Normal  Communication   Communication: HOH  Cognition Arousal/Alertness: Awake/alert Behavior During Therapy: WFL for tasks assessed/performed Overall Cognitive Status: Within Functional Limits for tasks assessed                      General Comments      Exercises Total Joint Exercises Hip ABduction/ADduction: AROM;Left;15 reps;Standing Long Arc Quad: AROM;15 reps;Standing;Left Knee Flexion: AROM;Left;15 reps;Standing Marching in Standing: AROM;Left;15 reps;Standing General Exercises - Lower Extremity Heel Raises: AROM;Left;15 reps;Standing   Assessment/Plan    PT Assessment Patent does not need any further PT services  PT Problem List            PT Treatment Interventions  PT Goals (Current goals can be found in the Care Plan section)  Acute Rehab PT Goals Patient Stated Goal: independence PT Goal Formulation: All assessment and education complete, DC therapy    Frequency     Barriers to discharge        Co-evaluation               End of Session   Activity Tolerance: Patient tolerated treatment well Patient left: in chair;with call bell/phone  within reach           Time: [id number] PT Time Calculation (min) (ACUTE ONLY): 18 min   Charges:   PT Evaluation $PT Eval Low Complexity: 1 Procedure     PT G Codes:        Weston Anna, MPT Pager: [phone]

## 2016-03-15 NOTE — Evaluation (Signed)
Occupational Therapy Evaluation Patient Details Name: Theodore Hutchinson MRN: [id number] DOB: 11-19-1945 Today's Date: 03/15/2016    History of Present Illness s/p L DA THA   Clinical Impression   This 71 year old man was admitted for the above sx. Will return once more prior to discharge to further educate on DME, toilet and shower transfers   Follow Up Recommendations  Supervision/Assistance - 24 hour    Equipment Recommendations  3 in 1 bedside commode    Recommendations for Other Services       Precautions / Restrictions Precautions Precautions: Fall Restrictions Weight Bearing Restrictions: No      Mobility Bed Mobility Overal bed mobility: Needs Assistance Bed Mobility: Supine to Sit     Supine to sit: Supervision     General bed mobility comments: HOB raised  Transfers Overall transfer level: Needs assistance Equipment used: Rolling walker (2 wheeled) Transfers: Sit to/from Stand Sit to Stand: Min guard         General transfer comment: for safety.  Cues for UE/LE placement    Balance                                            ADL Overall ADL's : Needs assistance/impaired     Grooming: Set up;Sitting       Lower Body Bathing: Minimal assistance;Sit to/from stand       Lower Body Dressing: Minimal assistance;Sit to/from stand   Toilet Transfer: Min guard;Stand-pivot;RW (to chair)             General ADL Comments: pt is not interested in AE.  Discussed long netted sponge on a stick, if he wants this (available at any bath and beauty section of store).  Took a few steps in room before sitting in chair.  Will return for bathroom transfers as breakfast arrived during our session     Vision     Perception     Praxis      Pertinent Vitals/Pain Pain Assessment: 0-10 Pain Score: 2  Pain Location: L hip Pain Descriptors / Indicators: Sore Pain Intervention(s): Limited activity within patient's tolerance;Monitored  during session;Premedicated before session;Repositioned;Ice applied     Hand Dominance     Extremity/Trunk Assessment Upper Extremity Assessment Upper Extremity Assessment: Overall WFL for tasks assessed           Communication Communication Communication: HOH   Cognition Arousal/Alertness: Awake/alert Behavior During Therapy: WFL for tasks assessed/performed Overall Cognitive Status: Within Functional Limits for tasks assessed                     General Comments       Exercises       Shoulder Instructions      Home Living Family/patient expects to be discharged to:: Private residence Living Arrangements: Spouse/significant other Available Help at Discharge: Family               Bathroom Shower/Tub: Walk-in Psychologist, prison and probation services: Standard     Home Equipment: None          Prior Functioning/Environment Level of Independence: Needs assistance        Comments: wife assisted with sock/shoe        OT Problem List: Decreased strength;Decreased knowledge of use of DME or AE;Pain   OT Treatment/Interventions: Self-care/ADL training;DME and/or AE instruction;Patient/family education  OT Goals(Current goals can be found in the care plan section) Acute Rehab OT Goals Patient Stated Goal: independence OT Goal Formulation: With patient Time For Goal Achievement: 03/22/16 Potential to Achieve Goals: Good  OT Frequency: Min 1X/week   Barriers to D/C:            Co-evaluation              End of Session    Activity Tolerance: Patient tolerated treatment well Patient left: in chair;with call bell/phone within reach   Time: [id number] OT Time Calculation (min): 13 min Charges:  OT General Charges $OT Visit: 1 Procedure OT Evaluation $OT Eval Low Complexity: 1 Procedure G-Codes:    Illiana Losurdo 14-Apr-2016, 8:25 AM Lesle Chris, OTR/L [phone] 14-Apr-2016

## 2016-03-15 NOTE — Progress Notes (Signed)
   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  He does not want 3:1 commode.  He would rather use his standard commode.  Cues for safety as pt "parked" RW once and picked it up to negotiate around an obstacle  Bed Mobility  General bed mobility comments oob  Restrictions  LLE Weight Bearing WBAT  Transfers  Equipment used Rolling walker (2 wheeled)  Sit to [place] transfer comment for safety  OT - End of Session  Activity Tolerance Patient tolerated treatment well  Patient left in chair;with call bell/phone within reach  OT Assessment/Plan  Follow Up Recommendations Supervision/Assistance - 24 hour  OT Equipment None recommended by OT (pt does not want 3:1 commode)  Acute Rehab OT Goals  Patient Stated Goal independence  OT Goal Formulation With patient  Time For Goal Achievement 03/22/16  Potential to [place], OTR/L [phone] 03/15/2016

## 2016-03-26 DIAGNOSIS — R269 Unspecified abnormalities of gait and mobility: Secondary | ICD-10-CM | POA: Diagnosis not present

## 2016-03-26 DIAGNOSIS — M1612 Unilateral primary osteoarthritis, left hip: Secondary | ICD-10-CM | POA: Diagnosis not present

## 2016-03-27 DIAGNOSIS — Z96642 Presence of left artificial hip joint: Secondary | ICD-10-CM | POA: Diagnosis not present

## 2016-04-03 DIAGNOSIS — E784 Other hyperlipidemia: Secondary | ICD-10-CM | POA: Diagnosis not present

## 2016-04-03 DIAGNOSIS — I10 Essential (primary) hypertension: Secondary | ICD-10-CM | POA: Diagnosis not present

## 2016-04-03 DIAGNOSIS — M545 Low back pain: Secondary | ICD-10-CM | POA: Diagnosis not present

## 2016-04-24 DIAGNOSIS — Z471 Aftercare following joint replacement surgery: Secondary | ICD-10-CM | POA: Diagnosis not present

## 2016-04-24 DIAGNOSIS — M7632 Iliotibial band syndrome, left leg: Secondary | ICD-10-CM | POA: Diagnosis not present

## 2016-04-24 DIAGNOSIS — Z96642 Presence of left artificial hip joint: Secondary | ICD-10-CM | POA: Diagnosis not present

## 2016-04-30 DIAGNOSIS — M545 Low back pain: Secondary | ICD-10-CM | POA: Diagnosis not present

## 2016-04-30 DIAGNOSIS — E784 Other hyperlipidemia: Secondary | ICD-10-CM | POA: Diagnosis not present

## 2016-04-30 DIAGNOSIS — I10 Essential (primary) hypertension: Secondary | ICD-10-CM | POA: Diagnosis not present

## 2016-05-13 DIAGNOSIS — Z1389 Encounter for screening for other disorder: Secondary | ICD-10-CM | POA: Diagnosis not present

## 2016-05-13 DIAGNOSIS — I10 Essential (primary) hypertension: Secondary | ICD-10-CM | POA: Diagnosis not present

## 2016-05-13 DIAGNOSIS — Z0000 Encounter for general adult medical examination without abnormal findings: Secondary | ICD-10-CM | POA: Diagnosis not present

## 2016-05-13 DIAGNOSIS — E298 Other testicular dysfunction: Secondary | ICD-10-CM | POA: Diagnosis not present

## 2016-05-14 DIAGNOSIS — M5442 Lumbago with sciatica, left side: Secondary | ICD-10-CM | POA: Diagnosis not present

## 2016-05-14 DIAGNOSIS — M47816 Spondylosis without myelopathy or radiculopathy, lumbar region: Secondary | ICD-10-CM | POA: Diagnosis not present

## 2016-05-14 DIAGNOSIS — M9903 Segmental and somatic dysfunction of lumbar region: Secondary | ICD-10-CM | POA: Diagnosis not present

## 2016-05-15 DIAGNOSIS — M5442 Lumbago with sciatica, left side: Secondary | ICD-10-CM | POA: Diagnosis not present

## 2016-05-15 DIAGNOSIS — M47816 Spondylosis without myelopathy or radiculopathy, lumbar region: Secondary | ICD-10-CM | POA: Diagnosis not present

## 2016-05-15 DIAGNOSIS — M9903 Segmental and somatic dysfunction of lumbar region: Secondary | ICD-10-CM | POA: Diagnosis not present

## 2016-05-20 DIAGNOSIS — M47816 Spondylosis without myelopathy or radiculopathy, lumbar region: Secondary | ICD-10-CM | POA: Diagnosis not present

## 2016-05-20 DIAGNOSIS — M9903 Segmental and somatic dysfunction of lumbar region: Secondary | ICD-10-CM | POA: Diagnosis not present

## 2016-05-20 DIAGNOSIS — M5442 Lumbago with sciatica, left side: Secondary | ICD-10-CM | POA: Diagnosis not present

## 2016-05-22 DIAGNOSIS — M9903 Segmental and somatic dysfunction of lumbar region: Secondary | ICD-10-CM | POA: Diagnosis not present

## 2016-05-22 DIAGNOSIS — M47816 Spondylosis without myelopathy or radiculopathy, lumbar region: Secondary | ICD-10-CM | POA: Diagnosis not present

## 2016-05-22 DIAGNOSIS — M5442 Lumbago with sciatica, left side: Secondary | ICD-10-CM | POA: Diagnosis not present

## 2016-05-24 DIAGNOSIS — M5442 Lumbago with sciatica, left side: Secondary | ICD-10-CM | POA: Diagnosis not present

## 2016-05-24 DIAGNOSIS — M47816 Spondylosis without myelopathy or radiculopathy, lumbar region: Secondary | ICD-10-CM | POA: Diagnosis not present

## 2016-05-24 DIAGNOSIS — M9903 Segmental and somatic dysfunction of lumbar region: Secondary | ICD-10-CM | POA: Diagnosis not present

## 2016-05-27 DIAGNOSIS — M9903 Segmental and somatic dysfunction of lumbar region: Secondary | ICD-10-CM | POA: Diagnosis not present

## 2016-05-27 DIAGNOSIS — M47816 Spondylosis without myelopathy or radiculopathy, lumbar region: Secondary | ICD-10-CM | POA: Diagnosis not present

## 2016-05-27 DIAGNOSIS — M5442 Lumbago with sciatica, left side: Secondary | ICD-10-CM | POA: Diagnosis not present

## 2016-05-29 DIAGNOSIS — I10 Essential (primary) hypertension: Secondary | ICD-10-CM | POA: Diagnosis not present

## 2016-05-29 DIAGNOSIS — E784 Other hyperlipidemia: Secondary | ICD-10-CM | POA: Diagnosis not present

## 2016-05-29 DIAGNOSIS — M545 Low back pain: Secondary | ICD-10-CM | POA: Diagnosis not present

## 2016-06-03 DIAGNOSIS — M9903 Segmental and somatic dysfunction of lumbar region: Secondary | ICD-10-CM | POA: Diagnosis not present

## 2016-06-03 DIAGNOSIS — M5442 Lumbago with sciatica, left side: Secondary | ICD-10-CM | POA: Diagnosis not present

## 2016-06-03 DIAGNOSIS — M47816 Spondylosis without myelopathy or radiculopathy, lumbar region: Secondary | ICD-10-CM | POA: Diagnosis not present

## 2016-06-06 DIAGNOSIS — M47816 Spondylosis without myelopathy or radiculopathy, lumbar region: Secondary | ICD-10-CM | POA: Diagnosis not present

## 2016-06-06 DIAGNOSIS — M5442 Lumbago with sciatica, left side: Secondary | ICD-10-CM | POA: Diagnosis not present

## 2016-06-06 DIAGNOSIS — M9903 Segmental and somatic dysfunction of lumbar region: Secondary | ICD-10-CM | POA: Diagnosis not present

## 2016-06-10 DIAGNOSIS — M47816 Spondylosis without myelopathy or radiculopathy, lumbar region: Secondary | ICD-10-CM | POA: Diagnosis not present

## 2016-06-10 DIAGNOSIS — M5442 Lumbago with sciatica, left side: Secondary | ICD-10-CM | POA: Diagnosis not present

## 2016-06-10 DIAGNOSIS — M9903 Segmental and somatic dysfunction of lumbar region: Secondary | ICD-10-CM | POA: Diagnosis not present

## 2016-06-13 DIAGNOSIS — M9903 Segmental and somatic dysfunction of lumbar region: Secondary | ICD-10-CM | POA: Diagnosis not present

## 2016-06-13 DIAGNOSIS — M5442 Lumbago with sciatica, left side: Secondary | ICD-10-CM | POA: Diagnosis not present

## 2016-06-13 DIAGNOSIS — M47816 Spondylosis without myelopathy or radiculopathy, lumbar region: Secondary | ICD-10-CM | POA: Diagnosis not present

## 2016-06-19 DIAGNOSIS — M9903 Segmental and somatic dysfunction of lumbar region: Secondary | ICD-10-CM | POA: Diagnosis not present

## 2016-06-19 DIAGNOSIS — M5442 Lumbago with sciatica, left side: Secondary | ICD-10-CM | POA: Diagnosis not present

## 2016-06-19 DIAGNOSIS — M47816 Spondylosis without myelopathy or radiculopathy, lumbar region: Secondary | ICD-10-CM | POA: Diagnosis not present

## 2016-06-27 DIAGNOSIS — M5442 Lumbago with sciatica, left side: Secondary | ICD-10-CM | POA: Diagnosis not present

## 2016-06-27 DIAGNOSIS — Z96642 Presence of left artificial hip joint: Secondary | ICD-10-CM | POA: Diagnosis not present

## 2016-06-27 DIAGNOSIS — Z471 Aftercare following joint replacement surgery: Secondary | ICD-10-CM | POA: Diagnosis not present

## 2016-06-28 DIAGNOSIS — M5442 Lumbago with sciatica, left side: Secondary | ICD-10-CM | POA: Diagnosis not present

## 2016-07-01 DIAGNOSIS — M5442 Lumbago with sciatica, left side: Secondary | ICD-10-CM | POA: Diagnosis not present

## 2016-07-03 DIAGNOSIS — L570 Actinic keratosis: Secondary | ICD-10-CM | POA: Diagnosis not present

## 2016-07-03 DIAGNOSIS — M5442 Lumbago with sciatica, left side: Secondary | ICD-10-CM | POA: Diagnosis not present

## 2016-07-03 DIAGNOSIS — Z85828 Personal history of other malignant neoplasm of skin: Secondary | ICD-10-CM | POA: Diagnosis not present

## 2016-07-05 DIAGNOSIS — M5442 Lumbago with sciatica, left side: Secondary | ICD-10-CM | POA: Diagnosis not present

## 2016-07-08 DIAGNOSIS — M5442 Lumbago with sciatica, left side: Secondary | ICD-10-CM | POA: Diagnosis not present

## 2016-07-10 DIAGNOSIS — M5442 Lumbago with sciatica, left side: Secondary | ICD-10-CM | POA: Diagnosis not present

## 2016-07-12 DIAGNOSIS — M5442 Lumbago with sciatica, left side: Secondary | ICD-10-CM | POA: Diagnosis not present

## 2016-07-15 DIAGNOSIS — M5442 Lumbago with sciatica, left side: Secondary | ICD-10-CM | POA: Diagnosis not present

## 2016-07-16 DIAGNOSIS — M5442 Lumbago with sciatica, left side: Secondary | ICD-10-CM | POA: Diagnosis not present

## 2016-07-25 DIAGNOSIS — M5442 Lumbago with sciatica, left side: Secondary | ICD-10-CM | POA: Diagnosis not present

## 2016-07-25 DIAGNOSIS — Z96642 Presence of left artificial hip joint: Secondary | ICD-10-CM | POA: Diagnosis not present

## 2016-07-25 DIAGNOSIS — M5416 Radiculopathy, lumbar region: Secondary | ICD-10-CM | POA: Diagnosis not present

## 2016-07-25 DIAGNOSIS — Z471 Aftercare following joint replacement surgery: Secondary | ICD-10-CM | POA: Diagnosis not present

## 2016-08-01 DIAGNOSIS — E784 Other hyperlipidemia: Secondary | ICD-10-CM | POA: Diagnosis not present

## 2016-08-01 DIAGNOSIS — I10 Essential (primary) hypertension: Secondary | ICD-10-CM | POA: Diagnosis not present

## 2016-08-01 DIAGNOSIS — M545 Low back pain: Secondary | ICD-10-CM | POA: Diagnosis not present

## 2016-08-03 DIAGNOSIS — M5442 Lumbago with sciatica, left side: Secondary | ICD-10-CM | POA: Diagnosis not present

## 2016-08-13 DIAGNOSIS — M5442 Lumbago with sciatica, left side: Secondary | ICD-10-CM | POA: Diagnosis not present

## 2016-08-13 DIAGNOSIS — M5136 Other intervertebral disc degeneration, lumbar region: Secondary | ICD-10-CM | POA: Diagnosis not present

## 2016-08-13 DIAGNOSIS — M5126 Other intervertebral disc displacement, lumbar region: Secondary | ICD-10-CM | POA: Diagnosis not present

## 2016-08-13 DIAGNOSIS — M5416 Radiculopathy, lumbar region: Secondary | ICD-10-CM | POA: Diagnosis not present

## 2016-10-07 DIAGNOSIS — M545 Low back pain: Secondary | ICD-10-CM | POA: Diagnosis not present

## 2016-10-07 DIAGNOSIS — E784 Other hyperlipidemia: Secondary | ICD-10-CM | POA: Diagnosis not present

## 2016-10-07 DIAGNOSIS — I10 Essential (primary) hypertension: Secondary | ICD-10-CM | POA: Diagnosis not present

## 2016-10-09 DIAGNOSIS — M5136 Other intervertebral disc degeneration, lumbar region: Secondary | ICD-10-CM | POA: Diagnosis not present

## 2016-10-09 DIAGNOSIS — M5416 Radiculopathy, lumbar region: Secondary | ICD-10-CM | POA: Diagnosis not present

## 2016-10-22 DIAGNOSIS — M5136 Other intervertebral disc degeneration, lumbar region: Secondary | ICD-10-CM | POA: Diagnosis not present

## 2016-10-22 DIAGNOSIS — M5126 Other intervertebral disc displacement, lumbar region: Secondary | ICD-10-CM | POA: Diagnosis not present

## 2016-11-26 DIAGNOSIS — M7632 Iliotibial band syndrome, left leg: Secondary | ICD-10-CM | POA: Diagnosis not present

## 2016-11-26 DIAGNOSIS — Z96642 Presence of left artificial hip joint: Secondary | ICD-10-CM | POA: Diagnosis not present

## 2016-11-26 DIAGNOSIS — Z471 Aftercare following joint replacement surgery: Secondary | ICD-10-CM | POA: Diagnosis not present

## 2016-11-26 DIAGNOSIS — M1612 Unilateral primary osteoarthritis, left hip: Secondary | ICD-10-CM | POA: Diagnosis not present

## 2016-11-27 DIAGNOSIS — R262 Difficulty in walking, not elsewhere classified: Secondary | ICD-10-CM | POA: Diagnosis not present

## 2016-11-27 DIAGNOSIS — M79605 Pain in left leg: Secondary | ICD-10-CM | POA: Diagnosis not present

## 2016-11-27 DIAGNOSIS — Z96642 Presence of left artificial hip joint: Secondary | ICD-10-CM | POA: Diagnosis not present

## 2016-11-27 DIAGNOSIS — M6281 Muscle weakness (generalized): Secondary | ICD-10-CM | POA: Diagnosis not present

## 2016-11-27 DIAGNOSIS — M25552 Pain in left hip: Secondary | ICD-10-CM | POA: Diagnosis not present

## 2016-11-29 DIAGNOSIS — M25552 Pain in left hip: Secondary | ICD-10-CM | POA: Diagnosis not present

## 2016-11-29 DIAGNOSIS — M79605 Pain in left leg: Secondary | ICD-10-CM | POA: Diagnosis not present

## 2016-11-29 DIAGNOSIS — R262 Difficulty in walking, not elsewhere classified: Secondary | ICD-10-CM | POA: Diagnosis not present

## 2016-11-29 DIAGNOSIS — Z96642 Presence of left artificial hip joint: Secondary | ICD-10-CM | POA: Diagnosis not present

## 2016-11-29 DIAGNOSIS — M6281 Muscle weakness (generalized): Secondary | ICD-10-CM | POA: Diagnosis not present

## 2016-12-02 DIAGNOSIS — M79605 Pain in left leg: Secondary | ICD-10-CM | POA: Diagnosis not present

## 2016-12-02 DIAGNOSIS — M25552 Pain in left hip: Secondary | ICD-10-CM | POA: Diagnosis not present

## 2016-12-02 DIAGNOSIS — R262 Difficulty in walking, not elsewhere classified: Secondary | ICD-10-CM | POA: Diagnosis not present

## 2016-12-02 DIAGNOSIS — M6281 Muscle weakness (generalized): Secondary | ICD-10-CM | POA: Diagnosis not present

## 2016-12-02 DIAGNOSIS — Z96642 Presence of left artificial hip joint: Secondary | ICD-10-CM | POA: Diagnosis not present

## 2016-12-04 DIAGNOSIS — M6281 Muscle weakness (generalized): Secondary | ICD-10-CM | POA: Diagnosis not present

## 2016-12-04 DIAGNOSIS — M25552 Pain in left hip: Secondary | ICD-10-CM | POA: Diagnosis not present

## 2016-12-04 DIAGNOSIS — M79605 Pain in left leg: Secondary | ICD-10-CM | POA: Diagnosis not present

## 2016-12-04 DIAGNOSIS — R262 Difficulty in walking, not elsewhere classified: Secondary | ICD-10-CM | POA: Diagnosis not present

## 2016-12-04 DIAGNOSIS — Z96642 Presence of left artificial hip joint: Secondary | ICD-10-CM | POA: Diagnosis not present

## 2016-12-06 DIAGNOSIS — M25552 Pain in left hip: Secondary | ICD-10-CM | POA: Diagnosis not present

## 2016-12-06 DIAGNOSIS — R262 Difficulty in walking, not elsewhere classified: Secondary | ICD-10-CM | POA: Diagnosis not present

## 2016-12-06 DIAGNOSIS — M6281 Muscle weakness (generalized): Secondary | ICD-10-CM | POA: Diagnosis not present

## 2016-12-06 DIAGNOSIS — M79605 Pain in left leg: Secondary | ICD-10-CM | POA: Diagnosis not present

## 2016-12-06 DIAGNOSIS — Z96642 Presence of left artificial hip joint: Secondary | ICD-10-CM | POA: Diagnosis not present

## 2016-12-09 DIAGNOSIS — M25552 Pain in left hip: Secondary | ICD-10-CM | POA: Diagnosis not present

## 2016-12-09 DIAGNOSIS — M6281 Muscle weakness (generalized): Secondary | ICD-10-CM | POA: Diagnosis not present

## 2016-12-09 DIAGNOSIS — Z96642 Presence of left artificial hip joint: Secondary | ICD-10-CM | POA: Diagnosis not present

## 2016-12-09 DIAGNOSIS — M79605 Pain in left leg: Secondary | ICD-10-CM | POA: Diagnosis not present

## 2016-12-09 DIAGNOSIS — R262 Difficulty in walking, not elsewhere classified: Secondary | ICD-10-CM | POA: Diagnosis not present

## 2016-12-10 DIAGNOSIS — M6281 Muscle weakness (generalized): Secondary | ICD-10-CM | POA: Diagnosis not present

## 2016-12-10 DIAGNOSIS — M79605 Pain in left leg: Secondary | ICD-10-CM | POA: Diagnosis not present

## 2016-12-10 DIAGNOSIS — M25552 Pain in left hip: Secondary | ICD-10-CM | POA: Diagnosis not present

## 2016-12-10 DIAGNOSIS — R262 Difficulty in walking, not elsewhere classified: Secondary | ICD-10-CM | POA: Diagnosis not present

## 2016-12-10 DIAGNOSIS — Z96642 Presence of left artificial hip joint: Secondary | ICD-10-CM | POA: Diagnosis not present

## 2016-12-11 DIAGNOSIS — M6281 Muscle weakness (generalized): Secondary | ICD-10-CM | POA: Diagnosis not present

## 2016-12-11 DIAGNOSIS — M79605 Pain in left leg: Secondary | ICD-10-CM | POA: Diagnosis not present

## 2016-12-11 DIAGNOSIS — R262 Difficulty in walking, not elsewhere classified: Secondary | ICD-10-CM | POA: Diagnosis not present

## 2016-12-11 DIAGNOSIS — M25552 Pain in left hip: Secondary | ICD-10-CM | POA: Diagnosis not present

## 2016-12-11 DIAGNOSIS — Z96642 Presence of left artificial hip joint: Secondary | ICD-10-CM | POA: Diagnosis not present

## 2016-12-16 DIAGNOSIS — M25552 Pain in left hip: Secondary | ICD-10-CM | POA: Diagnosis not present

## 2016-12-16 DIAGNOSIS — M79605 Pain in left leg: Secondary | ICD-10-CM | POA: Diagnosis not present

## 2016-12-16 DIAGNOSIS — M6281 Muscle weakness (generalized): Secondary | ICD-10-CM | POA: Diagnosis not present

## 2016-12-16 DIAGNOSIS — Z96642 Presence of left artificial hip joint: Secondary | ICD-10-CM | POA: Diagnosis not present

## 2016-12-16 DIAGNOSIS — R262 Difficulty in walking, not elsewhere classified: Secondary | ICD-10-CM | POA: Diagnosis not present

## 2016-12-18 DIAGNOSIS — M79605 Pain in left leg: Secondary | ICD-10-CM | POA: Diagnosis not present

## 2016-12-18 DIAGNOSIS — Z96642 Presence of left artificial hip joint: Secondary | ICD-10-CM | POA: Diagnosis not present

## 2016-12-18 DIAGNOSIS — M6281 Muscle weakness (generalized): Secondary | ICD-10-CM | POA: Diagnosis not present

## 2016-12-18 DIAGNOSIS — M25552 Pain in left hip: Secondary | ICD-10-CM | POA: Diagnosis not present

## 2016-12-18 DIAGNOSIS — R262 Difficulty in walking, not elsewhere classified: Secondary | ICD-10-CM | POA: Diagnosis not present

## 2016-12-23 DIAGNOSIS — M25552 Pain in left hip: Secondary | ICD-10-CM | POA: Diagnosis not present

## 2016-12-23 DIAGNOSIS — R262 Difficulty in walking, not elsewhere classified: Secondary | ICD-10-CM | POA: Diagnosis not present

## 2016-12-23 DIAGNOSIS — M79605 Pain in left leg: Secondary | ICD-10-CM | POA: Diagnosis not present

## 2016-12-23 DIAGNOSIS — M6281 Muscle weakness (generalized): Secondary | ICD-10-CM | POA: Diagnosis not present

## 2016-12-23 DIAGNOSIS — Z96642 Presence of left artificial hip joint: Secondary | ICD-10-CM | POA: Diagnosis not present

## 2016-12-25 DIAGNOSIS — Z96642 Presence of left artificial hip joint: Secondary | ICD-10-CM | POA: Diagnosis not present

## 2016-12-25 DIAGNOSIS — R262 Difficulty in walking, not elsewhere classified: Secondary | ICD-10-CM | POA: Diagnosis not present

## 2016-12-25 DIAGNOSIS — M25552 Pain in left hip: Secondary | ICD-10-CM | POA: Diagnosis not present

## 2016-12-25 DIAGNOSIS — M79605 Pain in left leg: Secondary | ICD-10-CM | POA: Diagnosis not present

## 2016-12-25 DIAGNOSIS — M6281 Muscle weakness (generalized): Secondary | ICD-10-CM | POA: Diagnosis not present

## 2017-01-06 DIAGNOSIS — L570 Actinic keratosis: Secondary | ICD-10-CM | POA: Diagnosis not present

## 2017-01-06 DIAGNOSIS — D485 Neoplasm of uncertain behavior of skin: Secondary | ICD-10-CM | POA: Diagnosis not present

## 2017-01-06 DIAGNOSIS — Z85828 Personal history of other malignant neoplasm of skin: Secondary | ICD-10-CM | POA: Diagnosis not present

## 2017-01-06 DIAGNOSIS — C44722 Squamous cell carcinoma of skin of right lower limb, including hip: Secondary | ICD-10-CM | POA: Diagnosis not present

## 2017-01-17 DIAGNOSIS — R42 Dizziness and giddiness: Secondary | ICD-10-CM | POA: Diagnosis not present

## 2017-01-17 DIAGNOSIS — R079 Chest pain, unspecified: Secondary | ICD-10-CM | POA: Diagnosis not present

## 2017-01-17 DIAGNOSIS — R55 Syncope and collapse: Secondary | ICD-10-CM | POA: Diagnosis not present

## 2017-01-28 DIAGNOSIS — M7632 Iliotibial band syndrome, left leg: Secondary | ICD-10-CM | POA: Diagnosis not present

## 2017-01-30 DIAGNOSIS — C44722 Squamous cell carcinoma of skin of right lower limb, including hip: Secondary | ICD-10-CM | POA: Diagnosis not present

## 2017-01-31 DIAGNOSIS — M545 Low back pain: Secondary | ICD-10-CM | POA: Diagnosis not present

## 2017-01-31 DIAGNOSIS — E298 Other testicular dysfunction: Secondary | ICD-10-CM | POA: Diagnosis not present

## 2017-01-31 DIAGNOSIS — I700 Atherosclerosis of aorta: Secondary | ICD-10-CM | POA: Diagnosis not present

## 2017-01-31 DIAGNOSIS — E7849 Other hyperlipidemia: Secondary | ICD-10-CM | POA: Diagnosis not present

## 2017-01-31 DIAGNOSIS — Z6826 Body mass index (BMI) 26.0-26.9, adult: Secondary | ICD-10-CM | POA: Diagnosis not present

## 2017-01-31 DIAGNOSIS — I10 Essential (primary) hypertension: Secondary | ICD-10-CM | POA: Diagnosis not present

## 2017-02-17 DIAGNOSIS — I10 Essential (primary) hypertension: Secondary | ICD-10-CM | POA: Diagnosis not present

## 2017-02-17 DIAGNOSIS — E7849 Other hyperlipidemia: Secondary | ICD-10-CM | POA: Diagnosis not present

## 2017-02-17 DIAGNOSIS — M545 Low back pain: Secondary | ICD-10-CM | POA: Diagnosis not present

## 2017-04-16 IMAGING — DX DG PORTABLE PELVIS
2 series · 2 of 2 positions shown · non-contrast
Comparison: 01/19/2016

CLINICAL DATA: Postop left hip replacement.

EXAM:
PORTABLE PELVIS 1-2 VIEWS

[pelvis ap (1 of 2)]
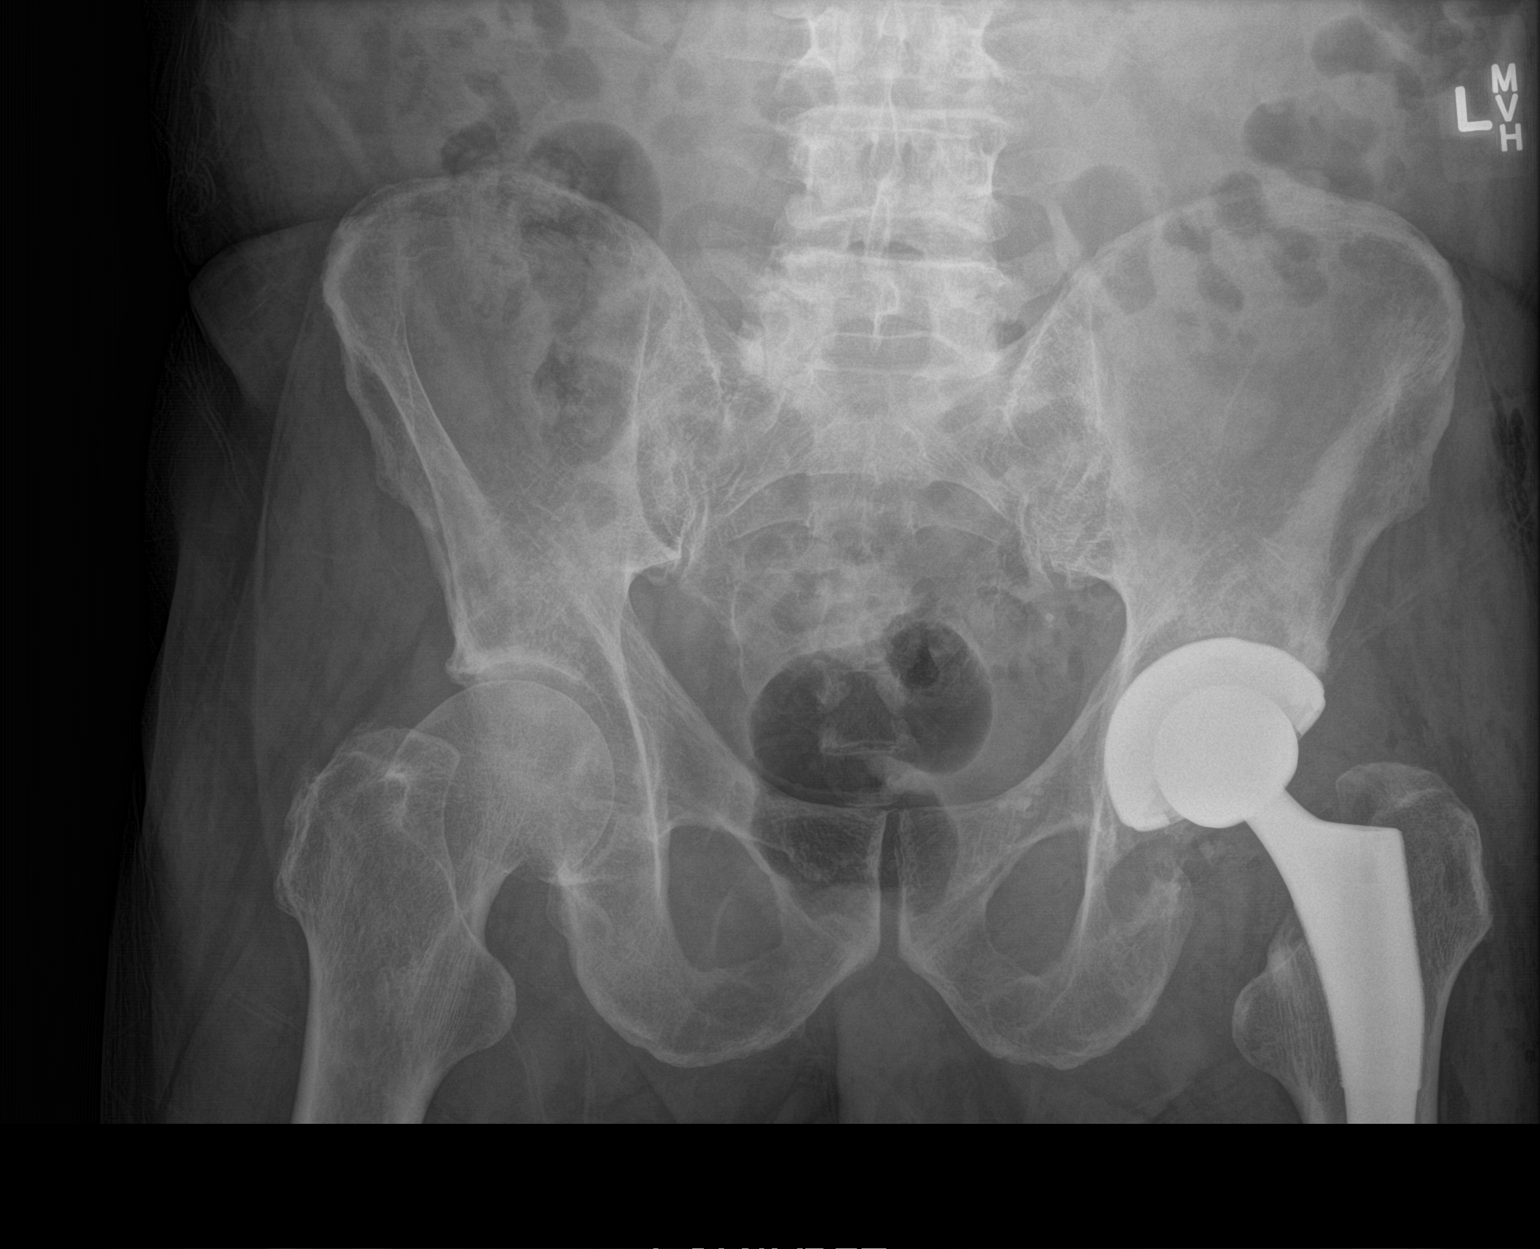

[pelvis ap (2 of 2)]
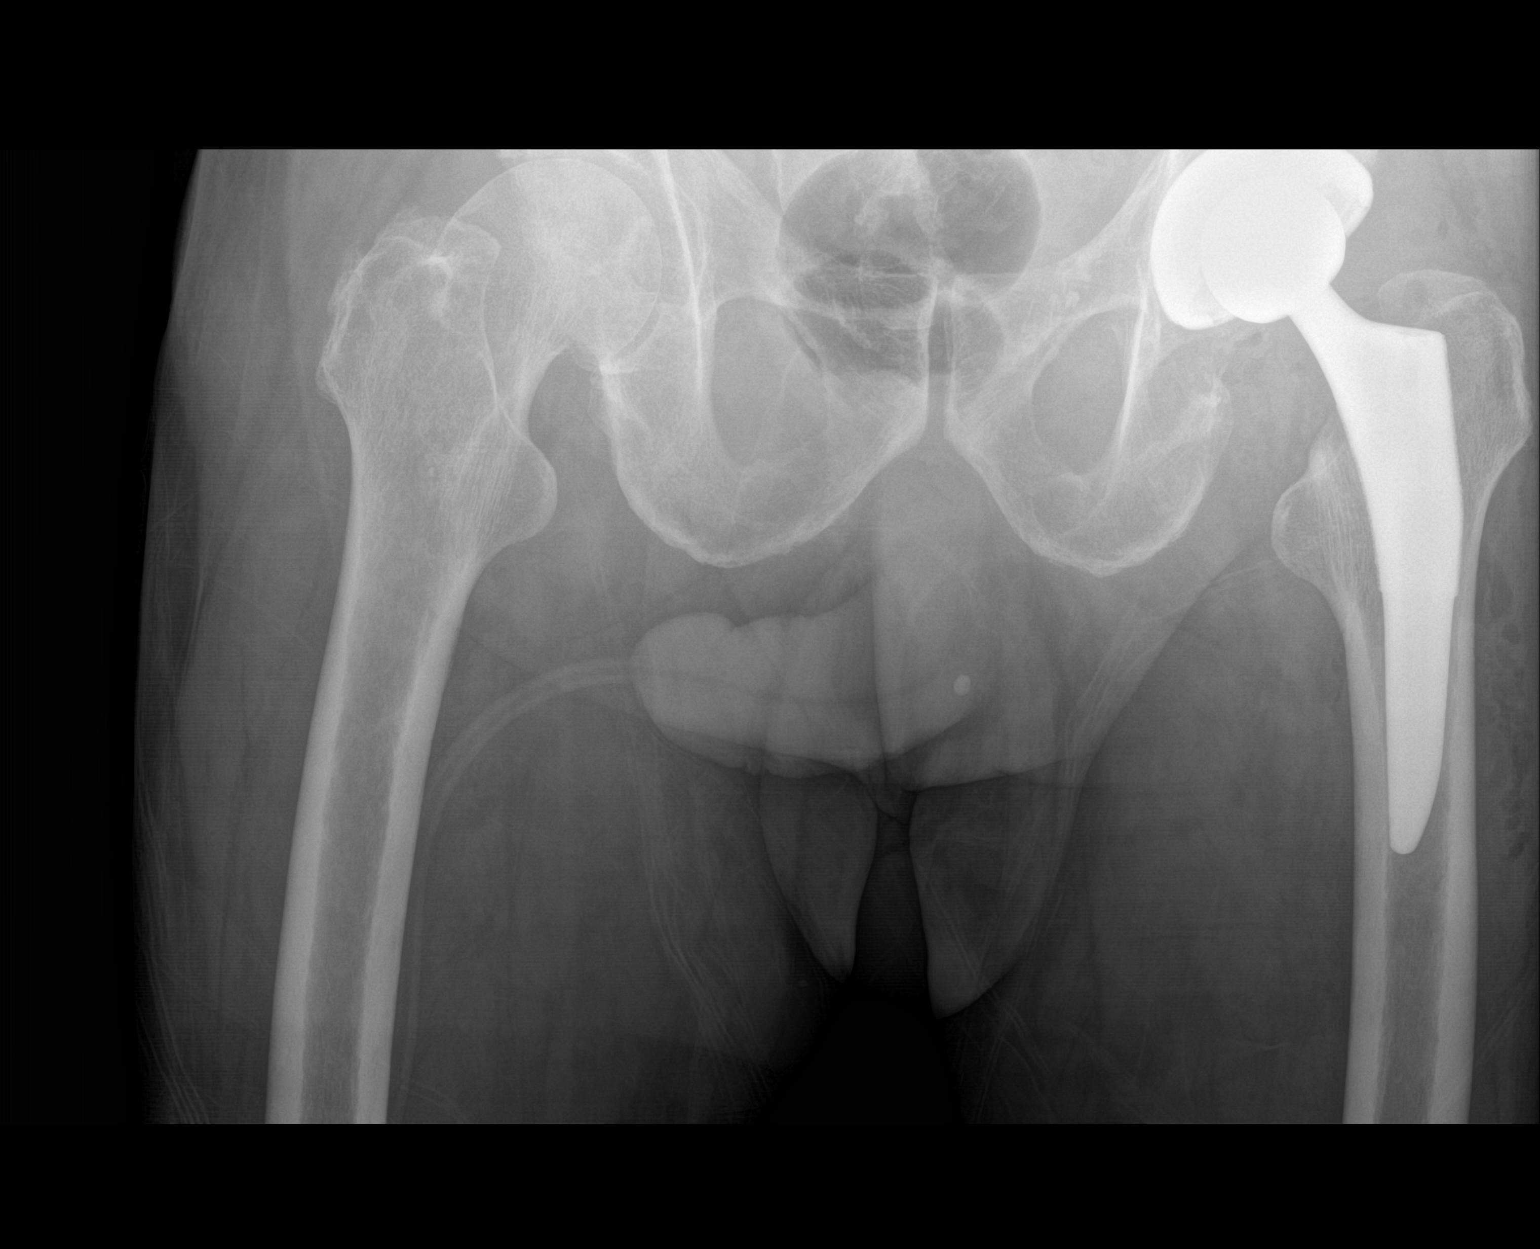

[2 of 2 positions shown; findings below may reference images not displayed]

FINDINGS: Changes of left hip replacement. Normal AP alignment. No hardware or
bony complicating feature.
IMPRESSION: Left hip replacement.  No complicating feature.

## 2017-04-16 IMAGING — RF DG HIP (WITH PELVIS) OPERATIVE*L*
1 series · 2 of 2 positions shown · non-contrast
Comparison: 01/19/2016

CLINICAL DATA: Left hip replacement

EXAM:
OPERATIVE left HIP (WITH PELVIS IF PERFORMED) 2 VIEWS
TECHNIQUE: Fluoroscopic spot image(s) were submitted for interpretation
post-operatively.

[Series 1: run · 2 of 2 slices shown]
[im 1/2]
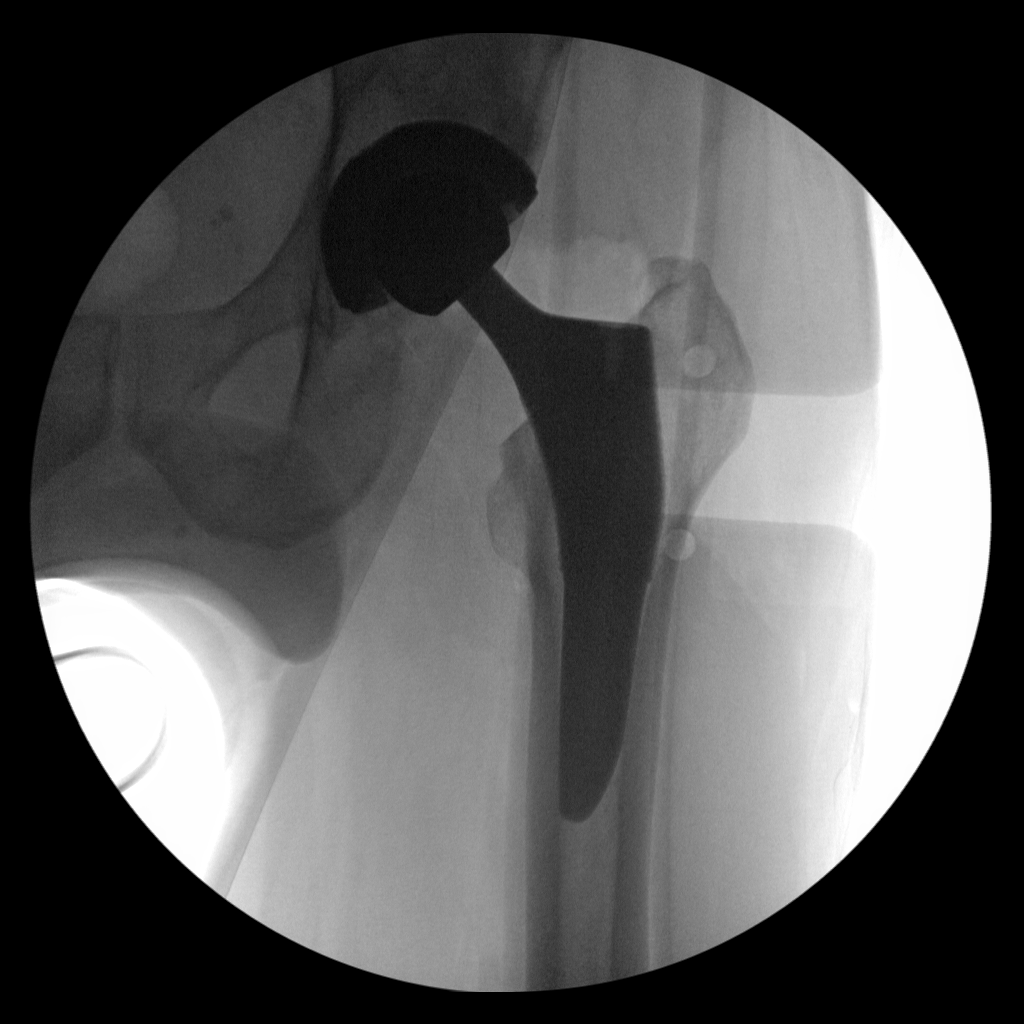
[im 2/2]
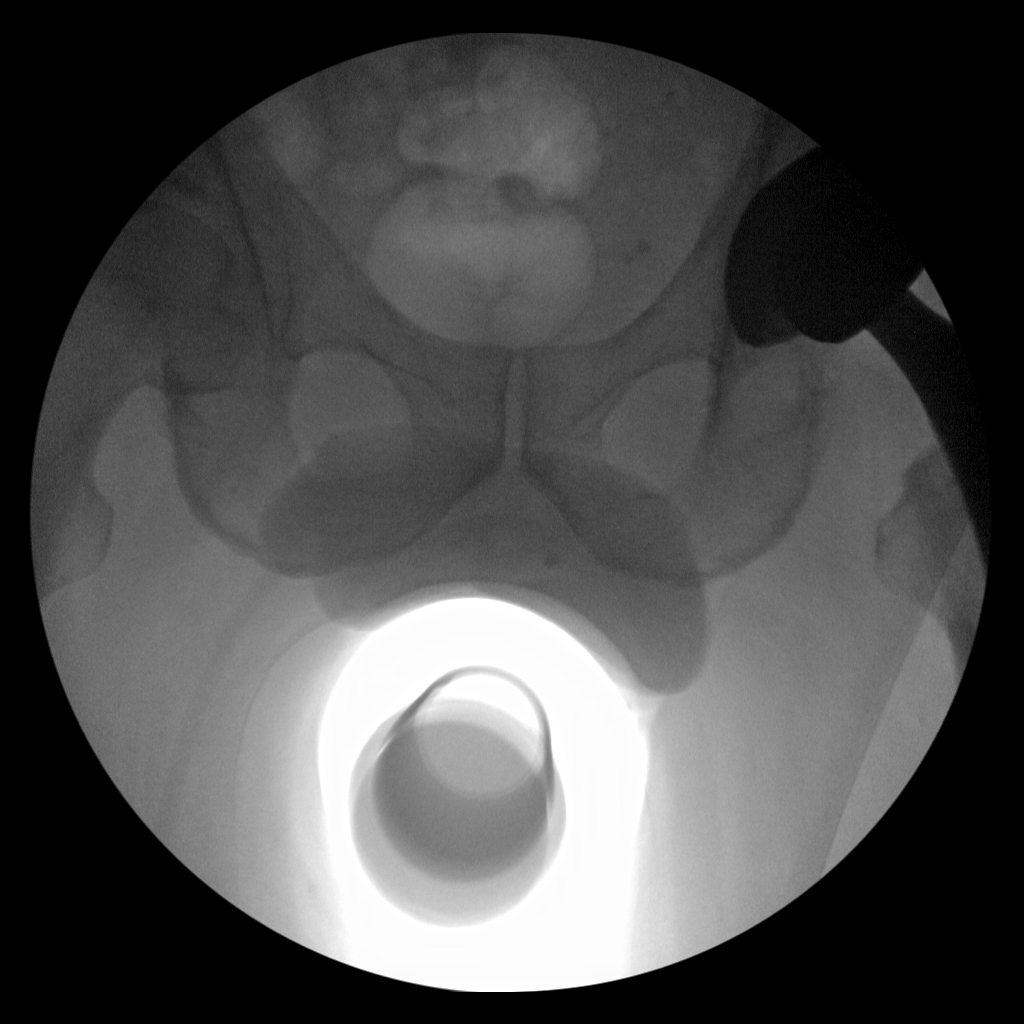

[2 of 2 positions shown; findings below may reference images not displayed]

FINDINGS: Total fluoroscopy time was 38 seconds. Two low resolution spot
intraoperative images of the left hip. Images demonstrate left hip
replacement with satisfactory alignment.
IMPRESSION: Intraoperative fluoroscopy provided during left hip replacement

## 2017-05-15 DIAGNOSIS — E7849 Other hyperlipidemia: Secondary | ICD-10-CM | POA: Diagnosis not present

## 2017-05-15 DIAGNOSIS — I10 Essential (primary) hypertension: Secondary | ICD-10-CM | POA: Diagnosis not present

## 2017-05-15 DIAGNOSIS — M545 Low back pain: Secondary | ICD-10-CM | POA: Diagnosis not present

## 2017-06-10 DIAGNOSIS — Z125 Encounter for screening for malignant neoplasm of prostate: Secondary | ICD-10-CM | POA: Diagnosis not present

## 2017-06-10 DIAGNOSIS — E7849 Other hyperlipidemia: Secondary | ICD-10-CM | POA: Diagnosis not present

## 2017-06-10 DIAGNOSIS — M545 Low back pain: Secondary | ICD-10-CM | POA: Diagnosis not present

## 2017-06-10 DIAGNOSIS — Z1389 Encounter for screening for other disorder: Secondary | ICD-10-CM | POA: Diagnosis not present

## 2017-06-10 DIAGNOSIS — I10 Essential (primary) hypertension: Secondary | ICD-10-CM | POA: Diagnosis not present

## 2017-06-10 DIAGNOSIS — E298 Other testicular dysfunction: Secondary | ICD-10-CM | POA: Diagnosis not present

## 2017-06-10 DIAGNOSIS — Z6826 Body mass index (BMI) 26.0-26.9, adult: Secondary | ICD-10-CM | POA: Diagnosis not present

## 2017-06-10 DIAGNOSIS — Z0000 Encounter for general adult medical examination without abnormal findings: Secondary | ICD-10-CM | POA: Diagnosis not present

## 2017-06-10 DIAGNOSIS — I700 Atherosclerosis of aorta: Secondary | ICD-10-CM | POA: Diagnosis not present

## 2017-06-13 DIAGNOSIS — M545 Low back pain: Secondary | ICD-10-CM | POA: Diagnosis not present

## 2017-06-13 DIAGNOSIS — I10 Essential (primary) hypertension: Secondary | ICD-10-CM | POA: Diagnosis not present

## 2017-06-13 DIAGNOSIS — E7849 Other hyperlipidemia: Secondary | ICD-10-CM | POA: Diagnosis not present

## 2017-07-08 DIAGNOSIS — L570 Actinic keratosis: Secondary | ICD-10-CM | POA: Diagnosis not present

## 2017-07-10 DIAGNOSIS — M85851 Other specified disorders of bone density and structure, right thigh: Secondary | ICD-10-CM | POA: Diagnosis not present

## 2017-07-10 DIAGNOSIS — M810 Age-related osteoporosis without current pathological fracture: Secondary | ICD-10-CM | POA: Diagnosis not present

## 2017-09-23 DIAGNOSIS — I10 Essential (primary) hypertension: Secondary | ICD-10-CM | POA: Diagnosis not present

## 2017-09-23 DIAGNOSIS — M545 Low back pain: Secondary | ICD-10-CM | POA: Diagnosis not present

## 2017-09-23 DIAGNOSIS — E7849 Other hyperlipidemia: Secondary | ICD-10-CM | POA: Diagnosis not present

## 2017-10-06 DIAGNOSIS — I10 Essential (primary) hypertension: Secondary | ICD-10-CM | POA: Diagnosis not present

## 2017-10-06 DIAGNOSIS — I700 Atherosclerosis of aorta: Secondary | ICD-10-CM | POA: Diagnosis not present

## 2017-10-06 DIAGNOSIS — M545 Low back pain: Secondary | ICD-10-CM | POA: Diagnosis not present

## 2017-10-06 DIAGNOSIS — Z6826 Body mass index (BMI) 26.0-26.9, adult: Secondary | ICD-10-CM | POA: Diagnosis not present

## 2017-10-06 DIAGNOSIS — Z0000 Encounter for general adult medical examination without abnormal findings: Secondary | ICD-10-CM | POA: Diagnosis not present

## 2017-10-06 DIAGNOSIS — Z1389 Encounter for screening for other disorder: Secondary | ICD-10-CM | POA: Diagnosis not present

## 2017-10-06 DIAGNOSIS — E7849 Other hyperlipidemia: Secondary | ICD-10-CM | POA: Diagnosis not present

## 2017-10-30 DIAGNOSIS — M545 Low back pain: Secondary | ICD-10-CM | POA: Diagnosis not present

## 2017-10-30 DIAGNOSIS — I10 Essential (primary) hypertension: Secondary | ICD-10-CM | POA: Diagnosis not present

## 2017-10-30 DIAGNOSIS — E7849 Other hyperlipidemia: Secondary | ICD-10-CM | POA: Diagnosis not present

## 2017-12-09 DIAGNOSIS — I10 Essential (primary) hypertension: Secondary | ICD-10-CM | POA: Diagnosis not present

## 2017-12-09 DIAGNOSIS — E7849 Other hyperlipidemia: Secondary | ICD-10-CM | POA: Diagnosis not present

## 2017-12-09 DIAGNOSIS — M545 Low back pain: Secondary | ICD-10-CM | POA: Diagnosis not present

## 2018-01-26 DIAGNOSIS — L570 Actinic keratosis: Secondary | ICD-10-CM | POA: Diagnosis not present

## 2018-04-01 DIAGNOSIS — E7849 Other hyperlipidemia: Secondary | ICD-10-CM | POA: Diagnosis not present

## 2018-04-01 DIAGNOSIS — M545 Low back pain: Secondary | ICD-10-CM | POA: Diagnosis not present

## 2018-04-01 DIAGNOSIS — I10 Essential (primary) hypertension: Secondary | ICD-10-CM | POA: Diagnosis not present

## 2018-04-29 DIAGNOSIS — E7849 Other hyperlipidemia: Secondary | ICD-10-CM | POA: Diagnosis not present

## 2018-04-29 DIAGNOSIS — I10 Essential (primary) hypertension: Secondary | ICD-10-CM | POA: Diagnosis not present

## 2018-04-29 DIAGNOSIS — M545 Low back pain: Secondary | ICD-10-CM | POA: Diagnosis not present

## 2018-05-27 DIAGNOSIS — E7849 Other hyperlipidemia: Secondary | ICD-10-CM | POA: Diagnosis not present

## 2018-05-27 DIAGNOSIS — I10 Essential (primary) hypertension: Secondary | ICD-10-CM | POA: Diagnosis not present

## 2018-05-27 DIAGNOSIS — M545 Low back pain: Secondary | ICD-10-CM | POA: Diagnosis not present

## 2018-07-07 DIAGNOSIS — L570 Actinic keratosis: Secondary | ICD-10-CM | POA: Diagnosis not present

## 2018-10-05 ENCOUNTER — Other Ambulatory Visit: Payer: Self-pay

## 2019-01-06 DIAGNOSIS — L821 Other seborrheic keratosis: Secondary | ICD-10-CM | POA: Diagnosis not present

## 2019-01-06 DIAGNOSIS — Z85828 Personal history of other malignant neoplasm of skin: Secondary | ICD-10-CM | POA: Diagnosis not present

## 2019-01-06 DIAGNOSIS — L570 Actinic keratosis: Secondary | ICD-10-CM | POA: Diagnosis not present

## 2019-06-04 DIAGNOSIS — E7849 Other hyperlipidemia: Secondary | ICD-10-CM | POA: Diagnosis not present

## 2019-06-04 DIAGNOSIS — M545 Low back pain: Secondary | ICD-10-CM | POA: Diagnosis not present

## 2019-06-04 DIAGNOSIS — I10 Essential (primary) hypertension: Secondary | ICD-10-CM | POA: Diagnosis not present

## 2019-07-06 DIAGNOSIS — L570 Actinic keratosis: Secondary | ICD-10-CM | POA: Diagnosis not present

## 2019-09-01 DIAGNOSIS — I10 Essential (primary) hypertension: Secondary | ICD-10-CM | POA: Diagnosis not present

## 2019-09-01 DIAGNOSIS — M545 Low back pain: Secondary | ICD-10-CM | POA: Diagnosis not present

## 2019-09-01 DIAGNOSIS — E7849 Other hyperlipidemia: Secondary | ICD-10-CM | POA: Diagnosis not present

## 2019-09-24 DIAGNOSIS — H2513 Age-related nuclear cataract, bilateral: Secondary | ICD-10-CM | POA: Diagnosis not present

## 2019-09-24 DIAGNOSIS — H40033 Anatomical narrow angle, bilateral: Secondary | ICD-10-CM | POA: Diagnosis not present

## 2019-10-01 DIAGNOSIS — Z01818 Encounter for other preprocedural examination: Secondary | ICD-10-CM | POA: Diagnosis not present

## 2019-10-01 DIAGNOSIS — H52223 Regular astigmatism, bilateral: Secondary | ICD-10-CM | POA: Diagnosis not present

## 2019-10-01 DIAGNOSIS — H25812 Combined forms of age-related cataract, left eye: Secondary | ICD-10-CM | POA: Diagnosis not present

## 2019-10-01 DIAGNOSIS — H25811 Combined forms of age-related cataract, right eye: Secondary | ICD-10-CM | POA: Diagnosis not present

## 2019-10-15 DIAGNOSIS — H25812 Combined forms of age-related cataract, left eye: Secondary | ICD-10-CM | POA: Diagnosis not present

## 2019-10-15 DIAGNOSIS — H2512 Age-related nuclear cataract, left eye: Secondary | ICD-10-CM | POA: Diagnosis not present

## 2019-11-12 DIAGNOSIS — H25811 Combined forms of age-related cataract, right eye: Secondary | ICD-10-CM | POA: Diagnosis not present

## 2019-11-12 DIAGNOSIS — H2511 Age-related nuclear cataract, right eye: Secondary | ICD-10-CM | POA: Diagnosis not present

## 2019-12-22 DIAGNOSIS — E7849 Other hyperlipidemia: Secondary | ICD-10-CM | POA: Diagnosis not present

## 2019-12-22 DIAGNOSIS — I10 Essential (primary) hypertension: Secondary | ICD-10-CM | POA: Diagnosis not present

## 2019-12-22 DIAGNOSIS — M545 Low back pain, unspecified: Secondary | ICD-10-CM | POA: Diagnosis not present

## 2020-01-18 DIAGNOSIS — L570 Actinic keratosis: Secondary | ICD-10-CM | POA: Diagnosis not present

## 2020-01-24 DIAGNOSIS — M545 Low back pain, unspecified: Secondary | ICD-10-CM | POA: Diagnosis not present

## 2020-01-24 DIAGNOSIS — I10 Essential (primary) hypertension: Secondary | ICD-10-CM | POA: Diagnosis not present

## 2020-01-24 DIAGNOSIS — E7849 Other hyperlipidemia: Secondary | ICD-10-CM | POA: Diagnosis not present

## 2020-07-11 DIAGNOSIS — L570 Actinic keratosis: Secondary | ICD-10-CM | POA: Diagnosis not present

## 2020-07-11 DIAGNOSIS — Z85828 Personal history of other malignant neoplasm of skin: Secondary | ICD-10-CM | POA: Diagnosis not present

## 2020-07-11 DIAGNOSIS — L821 Other seborrheic keratosis: Secondary | ICD-10-CM | POA: Diagnosis not present

## 2020-12-28 DIAGNOSIS — Z23 Encounter for immunization: Secondary | ICD-10-CM | POA: Diagnosis not present

## 2021-01-10 DIAGNOSIS — L570 Actinic keratosis: Secondary | ICD-10-CM | POA: Diagnosis not present

## 2021-06-21 DIAGNOSIS — M47816 Spondylosis without myelopathy or radiculopathy, lumbar region: Secondary | ICD-10-CM | POA: Diagnosis not present

## 2021-06-21 DIAGNOSIS — M9903 Segmental and somatic dysfunction of lumbar region: Secondary | ICD-10-CM | POA: Diagnosis not present

## 2021-06-21 DIAGNOSIS — M5442 Lumbago with sciatica, left side: Secondary | ICD-10-CM | POA: Diagnosis not present

## 2021-06-25 DIAGNOSIS — M9903 Segmental and somatic dysfunction of lumbar region: Secondary | ICD-10-CM | POA: Diagnosis not present

## 2021-06-25 DIAGNOSIS — M47816 Spondylosis without myelopathy or radiculopathy, lumbar region: Secondary | ICD-10-CM | POA: Diagnosis not present

## 2021-06-25 DIAGNOSIS — M5442 Lumbago with sciatica, left side: Secondary | ICD-10-CM | POA: Diagnosis not present

## 2021-06-28 DIAGNOSIS — M47816 Spondylosis without myelopathy or radiculopathy, lumbar region: Secondary | ICD-10-CM | POA: Diagnosis not present

## 2021-06-28 DIAGNOSIS — M9903 Segmental and somatic dysfunction of lumbar region: Secondary | ICD-10-CM | POA: Diagnosis not present

## 2021-06-28 DIAGNOSIS — M5442 Lumbago with sciatica, left side: Secondary | ICD-10-CM | POA: Diagnosis not present

## 2021-07-05 DIAGNOSIS — M47816 Spondylosis without myelopathy or radiculopathy, lumbar region: Secondary | ICD-10-CM | POA: Diagnosis not present

## 2021-07-05 DIAGNOSIS — M9903 Segmental and somatic dysfunction of lumbar region: Secondary | ICD-10-CM | POA: Diagnosis not present

## 2021-07-05 DIAGNOSIS — M5442 Lumbago with sciatica, left side: Secondary | ICD-10-CM | POA: Diagnosis not present

## 2021-07-16 DIAGNOSIS — D0359 Melanoma in situ of other part of trunk: Secondary | ICD-10-CM | POA: Diagnosis not present

## 2021-07-16 DIAGNOSIS — L570 Actinic keratosis: Secondary | ICD-10-CM | POA: Diagnosis not present

## 2021-07-26 DIAGNOSIS — D0359 Melanoma in situ of other part of trunk: Secondary | ICD-10-CM | POA: Diagnosis not present

## 2021-07-26 DIAGNOSIS — C4359 Malignant melanoma of other part of trunk: Secondary | ICD-10-CM | POA: Diagnosis not present

## 2021-09-06 DIAGNOSIS — S0990XA Unspecified injury of head, initial encounter: Secondary | ICD-10-CM | POA: Diagnosis not present

## 2021-09-06 DIAGNOSIS — R0602 Shortness of breath: Secondary | ICD-10-CM | POA: Diagnosis not present

## 2021-09-06 DIAGNOSIS — J439 Emphysema, unspecified: Secondary | ICD-10-CM | POA: Diagnosis not present

## 2021-09-06 DIAGNOSIS — Z96642 Presence of left artificial hip joint: Secondary | ICD-10-CM | POA: Diagnosis not present

## 2021-09-06 DIAGNOSIS — E860 Dehydration: Secondary | ICD-10-CM | POA: Diagnosis not present

## 2021-09-06 DIAGNOSIS — Z743 Need for continuous supervision: Secondary | ICD-10-CM | POA: Diagnosis not present

## 2021-09-06 DIAGNOSIS — I350 Nonrheumatic aortic (valve) stenosis: Secondary | ICD-10-CM | POA: Diagnosis not present

## 2021-09-06 DIAGNOSIS — I349 Nonrheumatic mitral valve disorder, unspecified: Secondary | ICD-10-CM | POA: Diagnosis not present

## 2021-09-06 DIAGNOSIS — R739 Hyperglycemia, unspecified: Secondary | ICD-10-CM | POA: Diagnosis not present

## 2021-09-06 DIAGNOSIS — Z043 Encounter for examination and observation following other accident: Secondary | ICD-10-CM | POA: Diagnosis not present

## 2021-09-06 DIAGNOSIS — N179 Acute kidney failure, unspecified: Secondary | ICD-10-CM | POA: Diagnosis not present

## 2021-09-06 DIAGNOSIS — R55 Syncope and collapse: Secondary | ICD-10-CM | POA: Diagnosis not present

## 2021-09-06 DIAGNOSIS — R531 Weakness: Secondary | ICD-10-CM | POA: Diagnosis not present

## 2021-09-06 DIAGNOSIS — M5030 Other cervical disc degeneration, unspecified cervical region: Secondary | ICD-10-CM | POA: Diagnosis not present

## 2021-09-06 DIAGNOSIS — I951 Orthostatic hypotension: Secondary | ICD-10-CM | POA: Diagnosis not present

## 2021-09-06 DIAGNOSIS — I340 Nonrheumatic mitral (valve) insufficiency: Secondary | ICD-10-CM | POA: Diagnosis not present

## 2021-10-03 DIAGNOSIS — R55 Syncope and collapse: Secondary | ICD-10-CM | POA: Diagnosis not present

## 2021-10-03 DIAGNOSIS — Z6824 Body mass index (BMI) 24.0-24.9, adult: Secondary | ICD-10-CM | POA: Diagnosis not present

## 2021-10-04 ENCOUNTER — Emergency Department (HOSPITAL_COMMUNITY): Payer: PPO

## 2021-10-04 ENCOUNTER — Other Ambulatory Visit: Payer: Self-pay

## 2021-10-04 ENCOUNTER — Observation Stay (HOSPITAL_COMMUNITY)
Admission: EM | Admit: 2021-10-04 | Discharge: 2021-10-05 | Disposition: A | Discharge status: Home / Self Care | Payer: PPO | Attending: General Practice | Admitting: General Practice

## 2021-10-04 DIAGNOSIS — N179 Acute kidney failure, unspecified: Secondary | ICD-10-CM | POA: Diagnosis not present

## 2021-10-04 DIAGNOSIS — Z79891 Long term (current) use of opiate analgesic: Secondary | ICD-10-CM | POA: Diagnosis not present

## 2021-10-04 DIAGNOSIS — Z7901 Long term (current) use of anticoagulants: Secondary | ICD-10-CM | POA: Insufficient documentation

## 2021-10-04 DIAGNOSIS — I2510 Atherosclerotic heart disease of native coronary artery without angina pectoris: Secondary | ICD-10-CM | POA: Diagnosis not present

## 2021-10-04 DIAGNOSIS — D649 Anemia, unspecified: Secondary | ICD-10-CM | POA: Diagnosis not present

## 2021-10-04 DIAGNOSIS — Z23 Encounter for immunization: Secondary | ICD-10-CM | POA: Diagnosis not present

## 2021-10-04 DIAGNOSIS — R55 Syncope and collapse: Secondary | ICD-10-CM | POA: Diagnosis not present

## 2021-10-04 DIAGNOSIS — R739 Hyperglycemia, unspecified: Secondary | ICD-10-CM | POA: Insufficient documentation

## 2021-10-04 DIAGNOSIS — R778 Other specified abnormalities of plasma proteins: Secondary | ICD-10-CM | POA: Diagnosis not present

## 2021-10-04 DIAGNOSIS — J439 Emphysema, unspecified: Secondary | ICD-10-CM | POA: Diagnosis not present

## 2021-10-04 DIAGNOSIS — R0600 Dyspnea, unspecified: Secondary | ICD-10-CM | POA: Diagnosis not present

## 2021-10-04 DIAGNOSIS — R7989 Other specified abnormal findings of blood chemistry: Secondary | ICD-10-CM | POA: Diagnosis present

## 2021-10-04 DIAGNOSIS — Z96642 Presence of left artificial hip joint: Secondary | ICD-10-CM | POA: Insufficient documentation

## 2021-10-04 DIAGNOSIS — I6782 Cerebral ischemia: Secondary | ICD-10-CM | POA: Diagnosis not present

## 2021-10-04 DIAGNOSIS — K449 Diaphragmatic hernia without obstruction or gangrene: Secondary | ICD-10-CM | POA: Diagnosis not present

## 2021-10-04 DIAGNOSIS — G319 Degenerative disease of nervous system, unspecified: Secondary | ICD-10-CM | POA: Diagnosis not present

## 2021-10-04 DIAGNOSIS — R220 Localized swelling, mass and lump, head: Secondary | ICD-10-CM | POA: Diagnosis not present

## 2021-10-04 LAB — COMPREHENSIVE METABOLIC PANEL
ALT: 14 U/L (ref 0–44)
AST: 20 U/L (ref 15–41)
Albumin: 3.7 g/dL (ref 3.5–5.0)
Alkaline Phosphatase: 70 U/L (ref 38–126)
Anion gap: 9 (ref 5–15)
BUN: 14 mg/dL (ref 8–23)
CO2: 27 mmol/L (ref 22–32)
Calcium: 9.1 mg/dL (ref 8.9–10.3)
Chloride: 105 mmol/L (ref 98–111)
Creatinine, Ser: 1.32 mg/dL — ABNORMAL HIGH (ref 0.61–1.24)
GFR, Estimated: 56 mL/min — ABNORMAL LOW (ref >60)
Glucose, Bld: 162 mg/dL — ABNORMAL HIGH (ref 70–99)
Potassium: 3.7 mmol/L (ref 3.5–5.1)
Sodium: 141 mmol/L (ref 135–145)
Total Bilirubin: 1 mg/dL (ref 0.3–1.2)
Total Protein: 6.8 g/dL (ref 6.5–8.1)

## 2021-10-04 LAB — CBC WITH DIFFERENTIAL/PLATELET
Abs Immature Granulocytes: 0.03 10*3/uL (ref 0.00–0.07)
Basophils Absolute: 0 10*3/uL (ref 0.0–0.1)
Basophils Relative: 0 %
Eosinophils Absolute: 0 10*3/uL (ref 0.0–0.5)
Eosinophils Relative: 0 %
HCT: 35.3 % — ABNORMAL LOW (ref 39.0–52.0)
Hemoglobin: 12.3 g/dL — ABNORMAL LOW (ref 13.0–17.0)
Immature Granulocytes: 0 %
Lymphocytes Relative: 26 %
Lymphs Abs: 2.1 10*3/uL (ref 0.7–4.0)
MCH: 34.1 pg — ABNORMAL HIGH (ref 26.0–34.0)
MCHC: 34.8 g/dL (ref 30.0–36.0)
MCV: 97.8 fL (ref 80.0–100.0)
Monocytes Absolute: 0.2 10*3/uL (ref 0.1–1.0)
Monocytes Relative: 2 %
Neutro Abs: 5.9 10*3/uL (ref 1.7–7.7)
Neutrophils Relative %: 72 %
Platelets: 174 10*3/uL (ref 150–400)
RBC: 3.61 MIL/uL — ABNORMAL LOW (ref 4.22–5.81)
RDW: 12.9 % (ref 11.5–15.5)
WBC: 8.3 10*3/uL (ref 4.0–10.5)
nRBC: 0 % (ref 0.0–0.2)

## 2021-10-04 LAB — CBG MONITORING, ED: Glucose-Capillary: 172 mg/dL — ABNORMAL HIGH (ref 70–99)

## 2021-10-04 LAB — TROPONIN I (HIGH SENSITIVITY): Troponin I (High Sensitivity): 10 ng/L (ref <18)

## 2021-10-04 MED ORDER — IOHEXOL 350 MG/ML SOLN
53.0000 mL | Freq: Once | INTRAVENOUS | Status: AC | PRN
  Administered 2021-10-04: 53 mL via INTRAVENOUS

## 2021-10-04 MED ORDER — ONDANSETRON HCL 4 MG/2ML IJ SOLN
4.0000 mg | Freq: Once | INTRAMUSCULAR | Status: AC
  Administered 2021-10-04: 4 mg via INTRAVENOUS

## 2021-10-04 NOTE — ED Notes (Signed)
Pt began vomiting, verbal order for zofran obtained and administered

## 2021-10-04 NOTE — ED Provider Notes (Signed)
[facility] EMERGENCY DEPARTMENT Provider Note   [name]: [id number] Arrival date & time: 10/04/21  2137     History  Chief Complaint  Patient presents with   Loss of Consciousness    Theodore Hutchinson is a 76 y.o. male who denies significant past medical history who is presenting with unresponsive episode.  Patient brought in by EMS.  EMS contributes history.  They report that they were originally called out to the scene for shortness of breath, upon arrival found the patient unresponsive.  Patient initially hypotensive with systolics in the 96G, satting 80% on room air.  Patient placed on nonrebreather, with improved saturation.  Patient returned to baseline in route.  EMS reports patient was diaphoretic, began vomiting.  Upon arrival, patient immediately begins vomiting.  Patient denies any dizziness, headache, chest pain, abdominal pain.  Patient notes some subjective shortness of breath, and feels as if he cannot "take a deep breath".  Home Medications Prior to Admission medications   Medication Sig Start Date End Date Taking? Authorizing Provider  apixaban (ELIQUIS) 2.5 MG TABS tablet Take 1 tablet (2.5 mg total) by mouth every 12 (twelve) hours. 03/15/16   Swinteck, Aaron Edelman, MD  docusate sodium (COLACE) 100 MG capsule Take 1 capsule (100 mg total) by mouth 2 (two) times daily. 03/15/16   Swinteck, Aaron Edelman, MD  FIBER PO Take 1 tablet by mouth daily.    [provider]  HYDROcodone-acetaminophen (NORCO/VICODIN) 5-325 MG tablet Take 1-2 tablets by mouth every 4 (four) hours as needed (breakthrough pain). 03/15/16   Swinteck, Aaron Edelman, MD  methocarbamol (ROBAXIN) 500 MG tablet Take 1 tablet (500 mg total) by mouth every 6 (six) hours as needed for muscle spasms. 03/15/16   Swinteck, Aaron Edelman, MD  Multiple Vitamin (MULTIVITAMIN WITH MINERALS) TABS tablet Take 1 tablet by mouth daily.    [provider]  Omega-3 Fatty Acids (FISH OIL CONCENTRATE) 300 MG CAPS Take 900 mg by  mouth daily.    [provider]  ondansetron (ZOFRAN) 4 MG tablet Take 1 tablet (4 mg total) by mouth every 6 (six) hours as needed for nausea. 03/15/16   Swinteck, Aaron Edelman, MD  senna (SENOKOT) 8.6 MG TABS tablet Take 2 tablets (17.2 mg total) by mouth at bedtime. 03/15/16   Swinteck, Aaron Edelman, MD      Allergies    Patient has no known allergies.    Review of Systems   Review of Systems As in HPI.  Physical Exam Updated Vital Signs BP (!) 140/74   Pulse 73   Temp (!) 96.6 F (35.9 C) (Oral)   Resp 16   SpO2 94%  Physical Exam Vitals and nursing note reviewed.  Constitutional:      General: He is not in acute distress.    Appearance: Normal appearance. He is well-developed.     Comments: Uncomfortable appearing male who is actively vomiting.  HENT:     Head: Normocephalic and atraumatic.     Right Ear: External ear normal.     Left Ear: External ear normal.     Nose: Nose normal.  Cardiovascular:     Rate and Rhythm: Normal rate and regular rhythm.     Heart sounds: No murmur heard. Pulmonary:     Effort: Pulmonary effort is normal. No respiratory distress.     Breath sounds: Normal breath sounds. No wheezing.  Abdominal:     Palpations: Abdomen is soft.     Tenderness: There is no abdominal tenderness.  Musculoskeletal:  Cervical back: Neck supple.  Skin:    General: Skin is warm and dry.  Neurological:     Mental Status: He is alert.     ED Results / Procedures / Treatments   Labs (all labs ordered are listed, but only abnormal results are displayed) Labs Reviewed  CBC WITH DIFFERENTIAL/PLATELET - Abnormal; Notable for the following components:      Result Value   RBC 3.61 (*)    Hemoglobin 12.3 (*)    HCT 35.3 (*)    MCH 34.1 (*)    All other components within normal limits  COMPREHENSIVE METABOLIC PANEL - Abnormal; Notable for the following components:   Glucose, Bld 162 (*)    Creatinine, Ser 1.32 (*)    GFR, Estimated 56 (*)    All other  components within normal limits  CBG MONITORING, ED - Abnormal; Notable for the following components:   Glucose-Capillary 172 (*)    All other components within normal limits  RAPID URINE DRUG SCREEN, HOSP PERFORMED  TROPONIN I (HIGH SENSITIVITY)    EKG EKG Interpretation  Date/Time:  Thursday October 04 2021 21:45:34 EDT Ventricular Rate:  74 PR Interval:  181 QRS Duration: 110 QT Interval:  435 QTC Calculation: 483 R Axis:   69 Text Interpretation: Sinus rhythm Minimal ST depression, inferior leads Borderline prolonged QT interval No old tracing to compare Confirmed by Sherwood Gambler [phone]) on 10/04/2021 11:19:17 PM  Radiology No results found.  Procedures Procedures    Medications Ordered in ED Medications  ondansetron (ZOFRAN) injection 4 mg (has no administration in time range)    ED Course/ Medical Decision Making/ A&P                           Medical Decision Making Amount and/or Complexity of Data Reviewed Labs: ordered. Radiology: ordered.  Risk Prescription drug management.   Theodore Hutchinson is a 76 y.o. male who denies significant past medical history who is presenting with unresponsive episode concerning for syncope.  Vitals at presentation within normal limits.  Patient is hemodynamically stable, afebrile, satting well on room air.  Patient immediately vomiting and given IV Zofran upon arrival.  Physical exam reassuring.  Normal heart sounds.  Lungs clear to auscultation bilaterally.  Abdomen is soft, nontender to palpation.  No pitting edema.  Lab work obtained, resulted notable for CMP with creatinine 1.32, no recent lab work available for comparison.  CBC without leukocytosis.  Mild anemia, hemoglobin 12.3.  POC glucose 172.  Initial troponin at 10.    EKG obtained, and interpreted by myself and my attending.  Demonstrates sinus rhythm, ventricular rate 74 bpm.  QTc mildly prolonged at 483 ms.  No prior EKG available on file for comparison.  Imaging  was pursued, including chest x-ray, CT head, CTA PE study to evaluate for potential pneumothorax, PE as cause of syncope, as well at CT head to evaluate for mass or traumatic injury. These images are pending at the time.   We will plan to admit the patient for high risk syncope/unresponsive episode for additional work-up, clinical monitoring, and evaluation.  The plan for this patient was discussed with Dr. Regenia Skeeter, who voiced agreement and who oversaw evaluation and treatment of this patient.   Final Clinical Impression(s) / ED Diagnoses Final diagnoses:  Syncope and collapse    Rx / DC Orders ED Discharge Orders     None         Harle Battiest,  Carlynn Spry, MD 10/05/21 6314    Sherwood Gambler, MD 10/08/21 [phone]

## 2021-10-04 NOTE — ED Triage Notes (Addendum)
Pt arrived via [facility] for unresponsive, respiratory distress.   EMS initially called out to scene for shortness of breath, finding patient unresponsive, protecting airway. Pt was hypotensive 80/40, 80% RA, HR 65, EKG showed elevation, EKG transmitted. Pt placed on NR with improve sats. Enroute pt regained consciousness, more responsive, but not oriented, diaphoretic, and began vomiting. Symptoms resolved approximately 15 minutes PTA, EGK now sinus rhythm, no N/V, alert and oriented (GCS 15, A&Ox4), transition to RA, oxygen saturation maintaining 96-100%, normotensive.    500 ml NS administered via 18g LAC.    PTA Vitals  BP 118/70 HR 65 SPO2 98% RA CBG 172

## 2021-10-05 ENCOUNTER — Other Ambulatory Visit: Payer: Self-pay | Admitting: Physician Assistant

## 2021-10-05 ENCOUNTER — Observation Stay (HOSPITAL_BASED_OUTPATIENT_CLINIC_OR_DEPARTMENT_OTHER): Payer: PPO

## 2021-10-05 ENCOUNTER — Encounter (HOSPITAL_COMMUNITY): Payer: Self-pay | Admitting: Internal Medicine

## 2021-10-05 ENCOUNTER — Observation Stay (INDEPENDENT_AMBULATORY_CARE_PROVIDER_SITE_OTHER)
Admit: 2021-10-05 | Discharge: 2021-10-05 | Disposition: A | Discharge status: Home / Self Care | Payer: PPO | Attending: Physician Assistant | Admitting: Physician Assistant

## 2021-10-05 ENCOUNTER — Encounter (HOSPITAL_COMMUNITY)
Admission: EM | Disposition: A | Discharge status: Home / Self Care | Payer: Self-pay | Source: Home / Self Care | Attending: Emergency Medicine

## 2021-10-05 DIAGNOSIS — R7989 Other specified abnormal findings of blood chemistry: Secondary | ICD-10-CM

## 2021-10-05 DIAGNOSIS — R55 Syncope and collapse: Secondary | ICD-10-CM

## 2021-10-05 DIAGNOSIS — R778 Other specified abnormalities of plasma proteins: Secondary | ICD-10-CM | POA: Diagnosis present

## 2021-10-05 DIAGNOSIS — I2510 Atherosclerotic heart disease of native coronary artery without angina pectoris: Secondary | ICD-10-CM | POA: Diagnosis not present

## 2021-10-05 HISTORY — PX: LEFT HEART CATH AND CORONARY ANGIOGRAPHY: CATH118249

## 2021-10-05 LAB — HEMOGLOBIN A1C
Hgb A1c MFr Bld: 5.4 % (ref 4.8–5.6)
Mean Plasma Glucose: 108.28 mg/dL

## 2021-10-05 LAB — ECHOCARDIOGRAM COMPLETE
AR max vel: 2.46 cm2
AV Area VTI: 2.45 cm2
AV Area mean vel: 2.26 cm2
AV Mean grad: 7.6 mmHg
AV Peak grad: 14.2 mmHg
Ao pk vel: 1.89 m/s
Area-P 1/2: 4.49 cm2
Height: 70 in
S' Lateral: 3.7 cm
Weight: 3040 oz

## 2021-10-05 LAB — CBC WITH DIFFERENTIAL/PLATELET
Abs Immature Granulocytes: 0.04 10*3/uL (ref 0.00–0.07)
Basophils Absolute: 0 10*3/uL (ref 0.0–0.1)
Basophils Relative: 0 %
Eosinophils Absolute: 0.1 10*3/uL (ref 0.0–0.5)
Eosinophils Relative: 1 %
HCT: 33.7 % — ABNORMAL LOW (ref 39.0–52.0)
Hemoglobin: 12 g/dL — ABNORMAL LOW (ref 13.0–17.0)
Immature Granulocytes: 0 %
Lymphocytes Relative: 9 %
Lymphs Abs: 1.1 10*3/uL (ref 0.7–4.0)
MCH: 33.8 pg (ref 26.0–34.0)
MCHC: 35.6 g/dL (ref 30.0–36.0)
MCV: 94.9 fL (ref 80.0–100.0)
Monocytes Absolute: 0.7 10*3/uL (ref 0.1–1.0)
Monocytes Relative: 6 %
Neutro Abs: 10.4 10*3/uL — ABNORMAL HIGH (ref 1.7–7.7)
Neutrophils Relative %: 84 %
Platelets: 189 10*3/uL (ref 150–400)
RBC: 3.55 MIL/uL — ABNORMAL LOW (ref 4.22–5.81)
RDW: 12.8 % (ref 11.5–15.5)
WBC: 12.4 10*3/uL — ABNORMAL HIGH (ref 4.0–10.5)
nRBC: 0 % (ref 0.0–0.2)

## 2021-10-05 LAB — HEPATIC FUNCTION PANEL
ALT: 14 U/L (ref 0–44)
AST: 17 U/L (ref 15–41)
Albumin: 3.3 g/dL — ABNORMAL LOW (ref 3.5–5.0)
Alkaline Phosphatase: 61 U/L (ref 38–126)
Bilirubin, Direct: 0.1 mg/dL (ref 0.0–0.2)
Indirect Bilirubin: 0.6 mg/dL (ref 0.3–0.9)
Total Bilirubin: 0.7 mg/dL (ref 0.3–1.2)
Total Protein: 6.4 g/dL — ABNORMAL LOW (ref 6.5–8.1)

## 2021-10-05 LAB — CBC
HCT: 32.5 % — ABNORMAL LOW (ref 39.0–52.0)
Hemoglobin: 11.9 g/dL — ABNORMAL LOW (ref 13.0–17.0)
MCH: 35.5 pg — ABNORMAL HIGH (ref 26.0–34.0)
MCHC: 36.6 g/dL — ABNORMAL HIGH (ref 30.0–36.0)
MCV: 97 fL (ref 80.0–100.0)
Platelets: 161 10*3/uL (ref 150–400)
RBC: 3.35 MIL/uL — ABNORMAL LOW (ref 4.22–5.81)
RDW: 13 % (ref 11.5–15.5)
WBC: 9.7 10*3/uL (ref 4.0–10.5)
nRBC: 0 % (ref 0.0–0.2)

## 2021-10-05 LAB — LIPID PANEL
Cholesterol: 170 mg/dL (ref 0–200)
HDL: 34 mg/dL — ABNORMAL LOW (ref >40)
LDL Cholesterol: 124 mg/dL — ABNORMAL HIGH (ref 0–99)
Total CHOL/HDL Ratio: 5 RATIO
Triglycerides: 58 mg/dL (ref <150)
VLDL: 12 mg/dL (ref 0–40)

## 2021-10-05 LAB — TROPONIN I (HIGH SENSITIVITY)
Troponin I (High Sensitivity): 122 ng/L (ref <18)
Troponin I (High Sensitivity): 143 ng/L (ref <18)
Troponin I (High Sensitivity): 111 ng/L (ref <18)

## 2021-10-05 LAB — BASIC METABOLIC PANEL
Anion gap: 7 (ref 5–15)
BUN: 15 mg/dL (ref 8–23)
CO2: 26 mmol/L (ref 22–32)
Calcium: 8.9 mg/dL (ref 8.9–10.3)
Chloride: 106 mmol/L (ref 98–111)
Creatinine, Ser: 1.18 mg/dL (ref 0.61–1.24)
GFR, Estimated: >60 mL/min (ref >60)
Glucose, Bld: 123 mg/dL — ABNORMAL HIGH (ref 70–99)
Potassium: 4.4 mmol/L (ref 3.5–5.1)
Sodium: 139 mmol/L (ref 135–145)

## 2021-10-05 LAB — IRON AND TIBC
Iron: 33 ug/dL — ABNORMAL LOW (ref 45–182)
Saturation Ratios: 13 % — ABNORMAL LOW (ref 17.9–39.5)
TIBC: 252 ug/dL (ref 250–450)
UIBC: 219 ug/dL

## 2021-10-05 LAB — RETICULOCYTES
Immature Retic Fract: 4.6 % (ref 2.3–15.9)
RBC.: 3.43 MIL/uL — ABNORMAL LOW (ref 4.22–5.81)
Retic Count, Absolute: 37 10*3/uL (ref 19.0–186.0)
Retic Ct Pct: 1.1 % (ref 0.4–3.1)

## 2021-10-05 LAB — FERRITIN: Ferritin: 65 ng/mL (ref 24–336)

## 2021-10-05 LAB — TSH: TSH: 1.049 u[IU]/mL (ref 0.350–4.500)

## 2021-10-05 LAB — VITAMIN B12: Vitamin B-12: 1210 pg/mL — ABNORMAL HIGH (ref 180–914)

## 2021-10-05 LAB — FOLATE: Folate: 25.5 ng/mL (ref >5.9)

## 2021-10-05 SURGERY — LEFT HEART CATH AND CORONARY ANGIOGRAPHY
Anesthesia: LOCAL

## 2021-10-05 MED ORDER — SODIUM CHLORIDE 0.9% FLUSH: 3.0000 mL | INTRAVENOUS | Status: DC | PRN

## 2021-10-05 MED ORDER — HEPARIN (PORCINE) 25000 UT/250ML-% IV SOLN
1150.0000 [IU]/h | INTRAVENOUS | Status: DC
  Administered 2021-10-05: 1150 [IU]/h via INTRAVENOUS
  Filled 2021-10-05: qty 250

## 2021-10-05 MED ORDER — HEPARIN SODIUM (PORCINE) 1000 UNIT/ML IJ SOLN
INTRAMUSCULAR | Status: AC
  Filled 2021-10-05: qty 10

## 2021-10-05 MED ORDER — LIDOCAINE HCL (PF) 1 % IJ SOLN
INTRAMUSCULAR | Status: AC
  Filled 2021-10-05: qty 30

## 2021-10-05 MED ORDER — VERAPAMIL HCL 2.5 MG/ML IV SOLN
INTRAVENOUS | Status: DC | PRN
  Administered 2021-10-05: 10 mL via INTRA_ARTERIAL

## 2021-10-05 MED ORDER — HYDRALAZINE HCL 20 MG/ML IJ SOLN: 10.0000 mg | INTRAMUSCULAR | Status: DC | PRN

## 2021-10-05 MED ORDER — MIDAZOLAM HCL 2 MG/2ML IJ SOLN
INTRAMUSCULAR | Status: AC
  Filled 2021-10-05: qty 2

## 2021-10-05 MED ORDER — HEPARIN (PORCINE) IN NACL 1000-0.9 UT/500ML-% IV SOLN
INTRAVENOUS | Status: AC
  Filled 2021-10-05: qty 500

## 2021-10-05 MED ORDER — VERAPAMIL HCL 2.5 MG/ML IV SOLN
INTRAVENOUS | Status: AC
Start: 2021-10-05 — End: ?
  Filled 2021-10-05: qty 2

## 2021-10-05 MED ORDER — ATORVASTATIN CALCIUM 40 MG PO TABS: 40.0000 mg | ORAL_TABLET | Freq: Every day | ORAL | 11 refills | Status: DC

## 2021-10-05 MED ORDER — LIDOCAINE HCL (PF) 1 % IJ SOLN
INTRAMUSCULAR | Status: DC | PRN
  Administered 2021-10-05: 2 mL

## 2021-10-05 MED ORDER — ASPIRIN 81 MG PO TBEC: 81.0000 mg | DELAYED_RELEASE_TABLET | Freq: Every day | ORAL | 0 refills | Status: AC

## 2021-10-05 MED ORDER — HEPARIN (PORCINE) IN NACL 2000-0.9 UNIT/L-% IV SOLN
INTRAVENOUS | Status: AC
  Filled 2021-10-05: qty 1000

## 2021-10-05 MED ORDER — FENTANYL CITRATE (PF) 100 MCG/2ML IJ SOLN
INTRAMUSCULAR | Status: AC
  Filled 2021-10-05: qty 2

## 2021-10-05 MED ORDER — SODIUM CHLORIDE 0.9 % WEIGHT BASED INFUSION: 3.0000 mL/kg/h | INTRAVENOUS | Status: AC

## 2021-10-05 MED ORDER — HEPARIN SODIUM (PORCINE) 1000 UNIT/ML IJ SOLN
INTRAMUSCULAR | Status: DC | PRN
  Administered 2021-10-05: 4500 [IU] via INTRAVENOUS

## 2021-10-05 MED ORDER — SODIUM CHLORIDE 0.9 % WEIGHT BASED INFUSION: 1.0000 mL/kg/h | INTRAVENOUS | Status: DC

## 2021-10-05 MED ORDER — SODIUM CHLORIDE 0.9% FLUSH: 3.0000 mL | Freq: Two times a day (BID) | INTRAVENOUS | Status: DC

## 2021-10-05 MED ORDER — HEPARIN BOLUS VIA INFUSION
4000.0000 [IU] | Freq: Once | INTRAVENOUS | Status: AC
  Administered 2021-10-05: 4000 [IU] via INTRAVENOUS
  Filled 2021-10-05: qty 4000

## 2021-10-05 MED ORDER — PNEUMOCOCCAL 20-VAL CONJ VACC 0.5 ML IM SUSY
0.5000 mL | PREFILLED_SYRINGE | INTRAMUSCULAR | Status: AC
  Administered 2021-10-05: 0.5 mL via INTRAMUSCULAR
  Filled 2021-10-05: qty 0.5

## 2021-10-05 MED ORDER — IOHEXOL 350 MG/ML SOLN
INTRAVENOUS | Status: DC | PRN
  Administered 2021-10-05: 50 mL

## 2021-10-05 MED ORDER — LABETALOL HCL 5 MG/ML IV SOLN: 10.0000 mg | INTRAVENOUS | Status: DC | PRN

## 2021-10-05 MED ORDER — HEPARIN (PORCINE) IN NACL 1000-0.9 UT/500ML-% IV SOLN
INTRAVENOUS | Status: DC | PRN
  Administered 2021-10-05 (×2): 500 mL

## 2021-10-05 MED ORDER — FENTANYL CITRATE (PF) 100 MCG/2ML IJ SOLN
INTRAMUSCULAR | Status: DC | PRN
  Administered 2021-10-05: 50 ug via INTRAVENOUS

## 2021-10-05 MED ORDER — MIDAZOLAM HCL 2 MG/2ML IJ SOLN
INTRAMUSCULAR | Status: DC | PRN
  Administered 2021-10-05: 2 mg via INTRAVENOUS

## 2021-10-05 MED ORDER — SODIUM CHLORIDE 0.9 % IV SOLN: 250.0000 mL | INTRAVENOUS | Status: DC | PRN

## 2021-10-05 MED ORDER — ASPIRIN 81 MG PO CHEW
81.0000 mg | CHEWABLE_TABLET | ORAL | Status: AC
  Administered 2021-10-05: 81 mg via ORAL
  Filled 2021-10-05: qty 1

## 2021-10-05 MED ORDER — SODIUM CHLORIDE 0.9 % IV SOLN: INTRAVENOUS | Status: AC

## 2021-10-05 SURGICAL SUPPLY — 9 items
BAND ZEPHYR COMPRESS 30 LONG (HEMOSTASIS) ×1 IMPLANT
CATH 5FR JL3.5 JR4 ANG PIG MP (CATHETERS) ×1 IMPLANT
GLIDESHEATH SLEND SS 6F .021 (SHEATH) ×1 IMPLANT
GUIDEWIRE INQWIRE 1.5J.035X260 (WIRE) IMPLANT
INQWIRE 1.5J .035X260CM (WIRE) ×2
KIT HEART LEFT (KITS) ×2 IMPLANT
PACK CARDIAC CATHETERIZATION (CUSTOM PROCEDURE TRAY) ×2 IMPLANT
TRANSDUCER W/STOPCOCK (MISCELLANEOUS) ×2 IMPLANT
TUBING CIL FLEX 10 FLL-RA (TUBING) ×2 IMPLANT

## 2021-10-05 NOTE — Progress Notes (Signed)
At the recommendation of Dr. Margaretann Loveless, will place Zio AT monitor prior to discharge. Staff message sent to arrange 6-7 weeks followup with Dr. Margaretann Loveless or APP.

## 2021-10-05 NOTE — Progress Notes (Signed)
Patient transferred from cath lab at 1440hrs. Right wrist with TR band in place, level zero.  Reviewed post cath instructions with patient, verbalized understanding.  Wife at bedside.

## 2021-10-05 NOTE — Progress Notes (Signed)
  Progress Note   Patient: Theodore Hutchinson:[id number] DOB: Feb 21, 1946 DOA: 10/04/2021     0 DOS: the patient was seen and examined on 10/05/2021   Brief hospital course: 76 y.o. M reportedly no PMH, presented to ER on 8/4 eval of syncope.   Reports similar episode -July 4 [facility] patient had a similar episode went he felt uncomfortable felt dizzy and almost lost consciousness he was taken to the ER and admitted to the hospital for 1 day because of which we do not have access to.  Patient states at that time he also had hit his head had CAT scan of his head 2D echo done and was told that he probably had dehydration.  Assessment and Plan:  Syncope ?multifactorial ?Vasovagal, no focal neuro deficits at this time. ?arrhythmia Does have slight elevation of troponin with ST elevation. Denies anginal symptoms - on heparin Gtt - TTE pending - Cardiology following along for possible ischemic workup Obtain orthostatics, check B12/Folate Obtain TSH Consider holter monitor with cardiology at outpatient Keep NPO f/u cardiology   Elevated troponin  +initial EKG showing ST elevation  - admitting MD d/w on-call cardiologist Dr. Stevan Born , advised starting patient on heparin infusion, aspirin trending cardiac markers will check 2D echo.   Keep patient NPO except medications.  Acute renal failure  -check UA.  Did receive fluid bolus. Recheck metabolic panel.  Creatinine has increased when compared to the one in Care Everywhere.  Hyperglycemia check hemoglobin A1c. Anemia we will check anemia panel follow CBC.     DVT prophylaxis: Heparin infusion. Code Status: Full code. Family Communication: Discussed with patient. Disposition Plan: Home. Consults called: Cardiologist. Admission status: Observation.          Subjective: No anginal symptoms, no arrhythmia noted on telemetry HDS, HR 64, BP low normal. No oxgen support. Temp normal  Physical Exam: Vitals:   10/05/21 0130  10/05/21 0230 10/05/21 0247 10/05/21 0414  BP: 117/66 104/66  93/60  Pulse: 72 71  74  Resp: '19 18  16  '$ Temp:   97.9 F (36.6 C) (!) 97.4 F (36.3 C)  TempSrc:   Oral Temporal  SpO2: 92% 94%  94%  Weight:      Height:        Eyes: Anicteric no pallor. ENMT: No discharge from the ears eyes nose and mouth. Neck: No mass felt.  No neck rigidity. Respiratory: No rhonchi or crepitations. Cardiovascular: S1-S2 heard. Abdomen: Soft nontender bowel sound present. Musculoskeletal: No edema. Skin: No rash. Neurologic: Alert awake oriented time place and person.  Moves all extremities. Psychiatric: Appears normal.  Normal affect. Data Reviewed:  There are no new results to review at this time.  Family Communication: none available at rounds  Disposition: Status is: Observation The patient remains OBS appropriate and will d/c before 2 midnights.  Planned Discharge Destination: Home    Time spent: 25 minutes  Author: Vanna Scotland, MD 10/05/2021 7:35 AM  For on call review [email or web address].

## 2021-10-05 NOTE — ED Notes (Signed)
Patient transported to Echo.

## 2021-10-05 NOTE — Interval H&P Note (Signed)
History and Physical Interval Note:  10/05/2021 12:42 PM  Theodore Hutchinson  has presented today for surgery, with the diagnosis of nstemi.  The various methods of treatment have been discussed with the patient and family. After consideration of risks, benefits and other options for treatment, the patient has consented to  Procedure(s): LEFT HEART CATH AND CORONARY ANGIOGRAPHY (N/A) as a surgical intervention.  The patient's history has been reviewed, patient examined, no change in status, stable for surgery.  I have reviewed the patient's chart and labs.  Questions were answered to the patient's satisfaction.    Cath Lab Visit (complete for each Cath Lab visit)  Clinical Evaluation Leading to the Procedure:   ACS: Yes.    Non-ACS:    Anginal Classification: No Symptoms  Anti-ischemic medical therapy: No Therapy  Non-Invasive Test Results: No non-invasive testing performed  Prior CABG: No previous CABG        Lauree Chandler

## 2021-10-05 NOTE — Consult Note (Signed)
Cardiology Consultation:   Patient ID: Theodore Hutchinson MRN: [id number]; DOB: 11/03/1945  Admit date: 10/04/2021 Date of Consult: 10/05/2021  PCP:  [name], MD   [place] Providers Cardiologist:  None        Patient Profile:   Theodore Hutchinson is a 76 y.o. male with no sig PMHx who is being seen 10/05/2021 for the evaluation of syncope and elevated troponin at the request of Dr Hal Hope.  History of Present Illness:   Theodore Hutchinson has no significant cardiac or past medical hx; he presented to the ED earlier this evening after experiencing a syncopal episode at home. He says he was laying in his recliner watching TV when he suddenly began to feel dizzy and diaphoretic. He had no chest pain or heaviness, no SOB, no palpitations, no symptoms other than the dizziness. He asked his wife to grab a cold rag for him, and he subsequently passed out. EMS was called, and pt diaphoretic and hypotensive upon their arrival. Pt still had no cardiac complaints. Initial EKG tracing showed ST elevation in inferior leads, but subsequent tracing showed no significant ST changes. Pt says he is very active and has not noticed anything out of the norm in regard to symptoms. He is able to do whatever he wants to do from exertional standpoint without limiting chest discomfort or DOE.  He did have another similar syncopal episode a couple weeks ago in [facility]. He was standing in line in [employer], and had onset of dizziness similar to this episode. He passed out while waiting in the line and was taken by EMS to [facility] He had full workup there including TTE, all of which sounds like it was unremarkable. No prior cardiac hx; no significant PMHx.   Past Medical History:  Diagnosis Date   Arthritis    osteoarthritis- left hip-radiates pain left leg    Past Surgical History:  Procedure Laterality Date   CERVICAL DISC SURGERY     x2   CERVICAL FUSION     Dr. 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/2016   Procedure: LEFT TOTAL HIP ARTHROPLASTY ANTERIOR APPROACH;  Surgeon: Rod Can, MD;  Location: [facility];  Service: Orthopedics;  Laterality: Left;     Home Medications:  No prescription meds Pt takes stool softener and fish oil  PLEASE NOTE: eliquis in home med list, but pt is not currently taking eliquis and says he has never been on eliquis or any other blood thinner  Inpatient Medications: Scheduled Meds:  Continuous Infusions:  PRN Meds:   Allergies:   No Known Allergies  Social History:   Social History   Socioeconomic History   Marital status: Married    Spouse name: Not on file   Number of children: Not on file   Years of education: Not on file   Highest education level: Not on file  Occupational History   Not on file  Tobacco Use   Smoking status: Former    Packs/day: 1.00    Years: 30.00    Total pack years: 30.00    Types: Cigarettes    Quit date: 03/08/2007    Years since quitting: 14.5   Smokeless tobacco: Never  Substance and Sexual Activity   Alcohol use:  Yes    Alcohol/week: 1.0 standard drink of alcohol    Types: 1 Cans of beer per week    Comment: rare social 2-3 per month   Drug use: No   Sexual activity: Not on file  Other Topics Concern   Not on file  Social History Narrative   Not on file   Social Determinants of Health   Financial Resource Strain: Not on file  Food Insecurity: Not on file  Transportation Needs: Not on file  Physical Activity: Not on file  Stress: Not on file  Social Connections: Not on file  Intimate Partner Violence: Not on file    Family History:   No family history on file.   ROS:  Please see the history of present illness.   All other ROS reviewed and negative.     Physical Exam/Data:   Vitals:   10/05/21  0115 10/05/21 0130 10/05/21 0230 10/05/21 0247  BP: 124/69 117/66 104/66   Pulse: 73 72 71   Resp: '19 19 18   '$ Temp:    97.9 F (36.6 C)  TempSrc:    Oral  SpO2: 92% 92% 94%   Weight:      Height:       No intake or output data in the 24 hours ending 10/05/21 0402    10/05/2021   12:00 AM 03/14/2016    6:39 PM 03/14/2016   10:28 AM  Last 3 Weights  Weight (lbs) 190 lb 184 lb 184 lb  Weight (kg) 86.183 kg 83.462 kg 83.462 kg     Body mass index is 27.26 kg/m.  General:  Well nourished, well developed, in no acute distress HEENT: normal Neck: no JVD Vascular: No carotid bruits; Distal pulses 2+ bilaterally Cardiac:  normal S1, S2; RRR; no murmur  Lungs:  clear to auscultation bilaterally, no wheezing, rhonchi or rales  Abd: soft, nontender, no hepatomegaly  Ext: no edema Musculoskeletal:  No deformities Skin: warm and dry  Neuro:  no focal abnormalities noted Psych:  Normal affect   EKG:  The EKG was personally reviewed and demonstrates:  NSR w/o acute ischemic changes. There are two tracings brought by EMS; one that shows ST elevation in the inferior leads, a second one w/ those changes completely resolved. No ST elevation on EKG done in the ED Telemetry:  Telemetry was personally reviewed and demonstrates:  NSR  Relevant CV Studies: none  Laboratory Data:  High Sensitivity Troponin:   Recent Labs  Lab 10/04/21 2149 10/05/21 0121  TROPONINIHS 10 122*     Chemistry Recent Labs  Lab 10/04/21 2149  NA 141  K 3.7  CL 105  CO2 27  GLUCOSE 162*  BUN 14  CREATININE 1.32*  CALCIUM 9.1  GFRNONAA 56*  ANIONGAP 9    Recent Labs  Lab 10/04/21 2149  PROT 6.8  ALBUMIN 3.7  AST 20  ALT 14  ALKPHOS 70  BILITOT 1.0   Lipids No results for input(s): "CHOL", "TRIG", "HDL", "LABVLDL", "LDLCALC", "CHOLHDL" in the last 168 hours.  Hematology Recent Labs  Lab 10/04/21 2149  WBC 8.3  RBC 3.61*  HGB 12.3*  HCT 35.3*  MCV 97.8  MCH 34.1*  MCHC 34.8  RDW 12.9   PLT 174   Thyroid No results for input(s): "TSH", "FREET4" in the last 168 hours.  BNPNo results for input(s): "BNP", "PROBNP" in the last 168 hours.  DDimer No results for input(s): "DDIMER" in the last 168 hours.   Radiology/Studies:  CT  Angio Chest PE W and/or Wo Contrast  Result Date: 10/04/2021 CLINICAL DATA:  Found unresponsive. EXAM: CT ANGIOGRAPHY CHEST WITH CONTRAST TECHNIQUE: Multidetector CT imaging of the chest was performed using the standard protocol during bolus administration of intravenous contrast. Multiplanar CT image reconstructions and MIPs were obtained to evaluate the vascular anatomy. RADIATION DOSE REDUCTION: This exam was performed according to the departmental dose-optimization program which includes automated exposure control, adjustment of the mA and/or kV according to patient size and/or use of iterative reconstruction technique. CONTRAST:  39m OMNIPAQUE IOHEXOL 350 MG/ML SOLN COMPARISON:  None Available. FINDINGS: Cardiovascular: There is mild to moderate severity calcification and atherosclerosis of the aortic arch and descending thoracic aorta, without evidence of aortic aneurysm. Satisfactory opacification of the pulmonary arteries to the segmental level. No evidence of pulmonary embolism. Normal heart size. No pericardial effusion. Mediastinum/Nodes: No enlarged mediastinal, hilar, or axillary lymph nodes. Thyroid gland, trachea, and esophagus demonstrate no significant findings. 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 There is no evidence of acute infiltrate, pleural effusion or pneumothorax. Upper Abdomen: There is a small hiatal hernia. Musculoskeletal: No chest wall abnormality. No acute or significant osseous findings. Review of the MIP images confirms the above findings. IMPRESSION: 1. No evidence of acute pulmonary embolism or other acute intrathoracic process. 2. Small hiatal hernia. Aortic  Atherosclerosis (ICD10-I70.0) and Emphysema (ICD10-J43.9). Electronically Signed   By: TVirgina NorfolkM.D.   On: 10/04/2021 23:28   CT Head Wo Contrast  Result Date: 10/04/2021 CLINICAL DATA:  Found unresponsive. EXAM: CT HEAD WITHOUT CONTRAST TECHNIQUE: Contiguous axial images were obtained from the base of the skull through the vertex without intravenous contrast. RADIATION DOSE REDUCTION: This exam was performed according to the departmental dose-optimization program which includes automated exposure control, adjustment of the mA and/or kV according to patient size and/or use of iterative reconstruction technique. COMPARISON:  None Available. FINDINGS: Brain: There is mild cerebral atrophy with widening of the extra-axial spaces and ventricular dilatation. There are areas of decreased attenuation within the white matter tracts of the supratentorial brain, consistent with microvascular disease changes. Vascular: No hyperdense vessel or unexpected calcification. Skull: Normal. Negative for fracture or focal lesion. Sinuses/Orbits: No acute finding. Other: Mild left parietooccipital scalp soft tissue swelling is seen. IMPRESSION: 1. Mild left parietooccipital scalp soft tissue swelling, without evidence of an acute fracture or acute intracranial abnormality. 2. Generalized cerebral atrophy with chronic white matter small vessel ischemic changes. Electronically Signed   By: TVirgina NorfolkM.D.   On: 10/04/2021 23:25   DG Chest Portable 1 View  Result Date: 10/04/2021 CLINICAL DATA:  Syncope and dyspnea EXAM: PORTABLE CHEST 1 VIEW COMPARISON:  Radiographs 03/14/2004 FINDINGS: Left basilar atelectasis/scarring similar to 03/14/2004. No pleural effusion or pneumothorax. Normal cardiomediastinal silhouette. Aortic calcification. Cervical spine fusion hardware. IMPRESSION: No active disease. Electronically Signed   By: TPlacido SouM.D.   On: 10/04/2021 22:16     Assessment and Plan:   Syncope: unclear  etiology. Pt says he had recent workup including TTE in [place] for a similar episode; we do not have those records, but he says workup there was unremarkable. If ischemia workup here is unremarkable, consider ambulatory monitoring to r/o arrhythmia. Abn trop: he has had no definite anginal sx, and no CP, DOE or other cardiac sx prior to his syncopal episode this evening. The recurrent nature of these recent episodes and the transient inferior ST elevation captured by EMS are concerning despite  overall lack of cardiac complaints. Recommend cycle troponin to see trend. Can put IV heparin on in the meantime. consider LHC for further evaluation. Would either obtain records re: recent TTE or repeat if records are not available.   Risk Assessment/Risk Scores:     TIMI Risk Score for Unstable Angina or Non-ST Elevation MI:   The patient's TIMI risk score is 2, which indicates a 8% risk of all cause mortality, new or recurrent myocardial infarction or need for urgent revascularization in the next 14 days.        For questions or updates, please contact [place] Please consult [email or web address] for contact info under    Signed, Rudean Curt, MD, [facility] 10/05/2021 4:02 AM

## 2021-10-05 NOTE — H&P (Signed)
History and Physical    Theodore Hutchinson BSJ:[id number] DOB: 07-19-1945 DOA: 10/04/2021  PCP: [name], MD  Patient coming from: Home.  Chief Complaint: Loss of consciousness.  HPI: Theodore Hutchinson is a 76 y.o. male with no significant past medical history was brought to the ER after patient had a brief spell of syncope.  Patient states he was lying on his recliner when he felt uncomfortable became diaphoretic nauseous and he called his wife to call EMS because he had a similar episode about a month ago when he was on vacation in [place].  When the EMS arrived patient was unconscious hypotensive with a systolic blood pressure in the 80s.  Initial EKG showed ST elevation in the inferior leads repeat one showed normal changes.  Patient did not have any chest pain or shortness of breath.  On the way to the ER patient had an episode of nausea vomiting but did not have any abdominal pain or diarrhea.  Patient regained consciousness within a few minutes.  Did not have any incontinence of urine.  On July 4 when patient had gone for vacation to [facility] patient had a similar episode went he felt uncomfortable felt dizzy and almost lost consciousness he was taken to the ER and admitted to the hospital for 1 day because of which we do not have access to.  Patient states at that time he also had hit his head had CAT scan of his head 2D echo done and was told that he probably had dehydration.  ED Course: In the ER patient had EKG which showed normal sinus rhythm high sensitive troponin initially was 10 and the second 1 was 122.  Creatinine is 1.3 blood glucose 162 hemoglobin 12.3.  CT head negative for anything acute except for scalp swelling and CT angiogram of the chest was negative for PE.  Discussed with cardiologist.  Since troponin is showing increasing trend and initial EKG at the side was showing ST elevation cardiology recommended starting patient on heparin.  They will be seeing in  consult.  Review of Systems: As per HPI, rest all negative.   Past Medical History:  Diagnosis Date   Arthritis    osteoarthritis- left hip-radiates pain left leg    Past Surgical History:  Procedure Laterality Date   CERVICAL DISC SURGERY     x2   CERVICAL FUSION     Dr. Patrice Paradise 1'06 [facility]   HERNIA REPAIR Left    open LIH/mesh 7''16   SHOULDER ARTHROSCOPY W/ ROTATOR CUFF REPAIR Right    stenosis intestines Right    age 71 "pyloric stenosis"   TOTAL HIP ARTHROPLASTY Left 03/14/2016   Procedure: LEFT TOTAL HIP ARTHROPLASTY ANTERIOR APPROACH;  Surgeon: Rod Can, MD;  Location: [facility];  Service: Orthopedics;  Laterality: Left;     reports that he quit smoking about 14 years ago. His smoking use included cigarettes. He has a 30.00 pack-year smoking history. He has never used smokeless tobacco. He reports current alcohol use of about 1.0 standard drink of alcohol per week. He reports that he does not use drugs.  No Known Allergies  Family History  Family history unknown: Yes    Prior to Admission medications   Medication Sig Start Date End Date Taking? Authorizing Provider  apixaban (ELIQUIS) 2.5 MG TABS tablet Take 1 tablet (2.5 mg total) by mouth every 12 (twelve) hours. 03/15/16   Swinteck, Aaron Edelman, MD  docusate sodium (COLACE) 100 MG capsule Take 1 capsule (  100 mg total) by mouth 2 (two) times daily. 03/15/16   Swinteck, Aaron Edelman, MD  FIBER PO Take 1 tablet by mouth daily.    [provider]  HYDROcodone-acetaminophen (NORCO/VICODIN) 5-325 MG tablet Take 1-2 tablets by mouth every 4 (four) hours as needed (breakthrough pain). 03/15/16   Swinteck, Aaron Edelman, MD  methocarbamol (ROBAXIN) 500 MG tablet Take 1 tablet (500 mg total) by mouth every 6 (six) hours as needed for muscle spasms. 03/15/16   Swinteck, Aaron Edelman, MD  Multiple Vitamin (MULTIVITAMIN WITH MINERALS) TABS tablet Take 1 tablet by mouth daily.    [provider]  Omega-3 Fatty Acids (FISH OIL CONCENTRATE) 300 MG  CAPS Take 900 mg by mouth daily.    [provider]  ondansetron (ZOFRAN) 4 MG tablet Take 1 tablet (4 mg total) by mouth every 6 (six) hours as needed for nausea. 03/15/16   Swinteck, Aaron Edelman, MD  senna (SENOKOT) 8.6 MG TABS tablet Take 2 tablets (17.2 mg total) by mouth at bedtime. 03/15/16   Rod Can, MD    Physical Exam: Constitutional: Moderately built and nourished. Vitals:   10/05/21 0115 10/05/21 0130 10/05/21 0230 10/05/21 0247  BP: 124/69 117/66 104/66   Pulse: 73 72 71   Resp: '19 19 18   '$ Temp:    97.9 F (36.6 C)  TempSrc:    Oral  SpO2: 92% 92% 94%   Weight:      Height:       Eyes: Anicteric no pallor. ENMT: No discharge from the ears eyes nose and mouth. Neck: No mass felt.  No neck rigidity. Respiratory: No rhonchi or crepitations. Cardiovascular: S1-S2 heard. Abdomen: Soft nontender bowel sound present. Musculoskeletal: No edema. Skin: No rash. Neurologic: Alert awake oriented time place and person.  Moves all extremities. Psychiatric: Appears normal.  Normal affect.   Labs on Admission: I have personally reviewed following labs and imaging studies  CBC: Recent Labs  Lab 10/04/21 2149  WBC 8.3  NEUTROABS 5.9  HGB 12.3*  HCT 35.3*  MCV 97.8  PLT 119   Basic Metabolic Panel: Recent Labs  Lab 10/04/21 2149  NA 141  K 3.7  CL 105  CO2 27  GLUCOSE 162*  BUN 14  CREATININE 1.32*  CALCIUM 9.1   GFR: Estimated Creatinine Clearance: 49.2 mL/min (A) (by C-G formula based on SCr of 1.32 mg/dL (H)). Liver Function Tests: Recent Labs  Lab 10/04/21 2149  AST 20  ALT 14  ALKPHOS 70  BILITOT 1.0  PROT 6.8  ALBUMIN 3.7   No results for input(s): "LIPASE", "AMYLASE" in the last 168 hours. No results for input(s): "AMMONIA" in the last 168 hours. Coagulation Profile: No results for input(s): "INR", "PROTIME" in the last 168 hours. Cardiac Enzymes: No results for input(s): "CKTOTAL", "CKMB", "CKMBINDEX", "TROPONINI" in the last 168  hours. BNP (last 3 results) No results for input(s): "PROBNP" in the last 8760 hours. HbA1C: No results for input(s): "HGBA1C" in the last 72 hours. CBG: Recent Labs  Lab 10/04/21 2154  GLUCAP 172*   Lipid Profile: No results for input(s): "CHOL", "HDL", "LDLCALC", "TRIG", "CHOLHDL", "LDLDIRECT" in the last 72 hours. Thyroid Function Tests: No results for input(s): "TSH", "T4TOTAL", "FREET4", "T3FREE", "THYROIDAB" in the last 72 hours. Anemia Panel: No results for input(s): "VITAMINB12", "FOLATE", "FERRITIN", "TIBC", "IRON", "RETICCTPCT" in the last 72 hours. Urine analysis: No results found for: "COLORURINE", "APPEARANCEUR", "LABSPEC", "PHURINE", "GLUCOSEU", "HGBUR", "BILIRUBINUR", "KETONESUR", "PROTEINUR", "UROBILINOGEN", "NITRITE", "LEUKOCYTESUR" Sepsis Labs: '@LABRCNTIP'$ (procalcitonin:4,lacticidven:4) )No results found for this or  any previous visit (from the past 240 hour(s)).   Radiological Exams on Admission: CT Angio Chest PE W and/or Wo Contrast  Result Date: 10/04/2021 CLINICAL DATA:  Found unresponsive. EXAM: CT ANGIOGRAPHY CHEST WITH CONTRAST TECHNIQUE: Multidetector CT imaging of the chest was performed using the standard protocol during bolus administration of intravenous contrast. Multiplanar CT image reconstructions and MIPs were obtained to evaluate the vascular anatomy. RADIATION DOSE REDUCTION: This exam was performed according to the departmental dose-optimization program which includes automated exposure control, adjustment of the mA and/or kV according to patient size and/or use of iterative reconstruction technique. CONTRAST:  55m OMNIPAQUE IOHEXOL 350 MG/ML SOLN COMPARISON:  None Available. FINDINGS: Cardiovascular: There is mild to moderate severity calcification and atherosclerosis of the aortic arch and descending thoracic aorta, without evidence of aortic aneurysm. Satisfactory opacification of the pulmonary arteries to the segmental level. No evidence of pulmonary  embolism. Normal heart size. No pericardial effusion. Mediastinum/Nodes: No enlarged mediastinal, hilar, or axillary lymph nodes. Thyroid gland, trachea, and esophagus demonstrate no significant findings. 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 There is no evidence of acute infiltrate, pleural effusion or pneumothorax. Upper Abdomen: There is a small hiatal hernia. Musculoskeletal: No chest wall abnormality. No acute or significant osseous findings. Review of the MIP images confirms the above findings. IMPRESSION: 1. No evidence of acute pulmonary embolism or other acute intrathoracic process. 2. Small hiatal hernia. Aortic Atherosclerosis (ICD10-I70.0) and Emphysema (ICD10-J43.9). Electronically Signed   By: TVirgina NorfolkM.D.   On: 10/04/2021 23:28   CT Head Wo Contrast  Result Date: 10/04/2021 CLINICAL DATA:  Found unresponsive. EXAM: CT HEAD WITHOUT CONTRAST TECHNIQUE: Contiguous axial images were obtained from the base of the skull through the vertex without intravenous contrast. RADIATION DOSE REDUCTION: This exam was performed according to the departmental dose-optimization program which includes automated exposure control, adjustment of the mA and/or kV according to patient size and/or use of iterative reconstruction technique. COMPARISON:  None Available. FINDINGS: Brain: There is mild cerebral atrophy with widening of the extra-axial spaces and ventricular dilatation. There are areas of decreased attenuation within the white matter tracts of the supratentorial brain, consistent with microvascular disease changes. Vascular: No hyperdense vessel or unexpected calcification. Skull: Normal. Negative for fracture or focal lesion. Sinuses/Orbits: No acute finding. Other: Mild left parietooccipital scalp soft tissue swelling is seen. IMPRESSION: 1. Mild left parietooccipital scalp soft tissue swelling, without evidence of an acute fracture  or acute intracranial abnormality. 2. Generalized cerebral atrophy with chronic white matter small vessel ischemic changes. Electronically Signed   By: TVirgina NorfolkM.D.   On: 10/04/2021 23:25   DG Chest Portable 1 View  Result Date: 10/04/2021 CLINICAL DATA:  Syncope and dyspnea EXAM: PORTABLE CHEST 1 VIEW COMPARISON:  Radiographs 03/14/2004 FINDINGS: Left basilar atelectasis/scarring similar to 03/14/2004. No pleural effusion or pneumothorax. Normal cardiomediastinal silhouette. Aortic calcification. Cervical spine fusion hardware. IMPRESSION: No active disease. Electronically Signed   By: TPlacido SouM.D.   On: 10/04/2021 22:16    EKG: Independently reviewed.  Initial EKG at the site showed ST elevation inferior leads.  Repeat 1 showed normal sinus rhythm with no ST-T changes.  Repeat EKG done in the ER shows normal sinus rhythm with no ST changes.  Discussed with Dr[facility]ristobal [facility].  Assessment/Plan Principal Problem:   Syncope and collapse Active Problems:   Elevated troponin    Elevated troponin with initial EKG showing ST elevation at the site repeat  one showing normal sinus rhythm with no ST-T changes.  Discussed with on-call cardiologist Dr. Stevan Born who will be seeing patient in consult, advised starting patient on heparin infusion, aspirin trending cardiac markers will check 2D echo.  Will Keep patient NPO except medications. Syncope cause not clear.  Was hypotensive at the site.  Improved with fluids.  Will monitor in telemetry check 2D echo.  This is a second episode. Acute renal failure -check UA.  Did receive fluid bolus.  Recheck metabolic panel.  Creatinine has increased when compared to the one in Care Everywhere. Hyperglycemia check hemoglobin A1c. Anemia we will check anemia panel follow CBC.   DVT prophylaxis: Heparin infusion. Code Status: Full code. Family Communication: Discussed with patient. Disposition Plan: Home. Consults called:  Cardiologist. Admission status: Observation.   Rise Patience MD [facility] Pager [phone].  If 7PM-7AM, please contact night-coverage [email or web address] Password [email or web address]  10/05/2021, 4:15 AM

## 2021-10-05 NOTE — ED Notes (Signed)
Notified Theodore Hutchinson

## 2021-10-05 NOTE — Progress Notes (Signed)
Interval attending progress note:  Troponin mildly elevated but with +delta trop. Syncope without chest pain or SOB but does have 3 vessel cor cals on CTA chest. We discussed shared decision making and deteremined that a LHC for diagnostic purposes may be reasonable. If severe disease, will discuss with IC on treatment plan.   INFORMED CONSENT: I have reviewed the risks, indications, and alternatives to cardiac catheterization, possible angioplasty, and stenting with the patient. Risks include but are not limited to bleeding, infection, vascular injury, stroke, myocardial infarction, arrhythmia, kidney injury, radiation-related injury in the case of prolonged fluoroscopy use, emergency cardiac surgery, and death. The patient understands the risks of serious complication is 1-2 in 1836 with diagnostic cardiac cath and 1-2% or less with angioplasty/stenting.   Continue IV heparin.

## 2021-10-05 NOTE — Progress Notes (Signed)
[facility] for heparin Indication: chest pain/ACS  Heparin Dosing Weight: 86.2 kg  Labs: Recent Labs    10/04/21 2149 10/05/21 0121  HGB 12.3*  --   HCT 35.3*  --   PLT 174  --   CREATININE 1.32*  --   TROPONINIHS 10 122*    Estimated Creatinine Clearance: 49.2 mL/min (A) (by C-G formula based on SCr of 1.32 mg/dL (H)).  Assessment: 76 yom presenting with CP, elevated high-sensitivity troponin. Pharmacy consulted to dose heparin. Patient is not on anticoagulation PTA. CTA negative for PE. CTH negative for acute intracranial process. Hg 12.3, plt wnl. No active bleed issues documented.  Goal of Therapy:  Heparin level 0.3-0.7 units/ml Monitor platelets by anticoagulation protocol: Yes   Plan:  Heparin 4000 unit bolus Start heparin at 1150 units/hr 6hr heparin level Monitor daily heparin level and CBC, s/sx bleeding F/u Cardiology plans   Arturo Morton, PharmD, BCPS Please check AMION for all [facility] contact numbers Clinical Pharmacist 10/05/2021 3:51 AM

## 2021-10-05 NOTE — H&P (View-Only) (Signed)
Cardiology Consultation:   Patient ID: Theodore Hutchinson MRN: [id number]; DOB: 1945/08/09  Admit date: 10/04/2021 Date of Consult: 10/05/2021  PCP:  [name], MD   [facility] Providers Cardiologist:  None        Patient Profile:   Theodore Hutchinson is a 76 y.o. male with no sig PMHx who is being seen 10/05/2021 for the evaluation of syncope and elevated troponin at the request of Dr Hal Hope.  History of Present Illness:   Theodore Hutchinson has no significant cardiac or past medical hx; he presented to the ED earlier this evening after experiencing a syncopal episode at home. He says he was laying in his recliner watching TV when he suddenly began to feel dizzy and diaphoretic. He had no chest pain or heaviness, no SOB, no palpitations, no symptoms other than the dizziness. He asked his wife to grab a cold rag for him, and he subsequently passed out. EMS was called, and pt diaphoretic and hypotensive upon their arrival. Pt still had no cardiac complaints. Initial EKG tracing showed ST elevation in inferior leads, but subsequent tracing showed no significant ST changes. Pt says he is very active and has not noticed anything out of the norm in regard to symptoms. He is able to do whatever he wants to do from exertional standpoint without limiting chest discomfort or DOE.  He did have another similar syncopal episode a couple weeks ago in [facility]. He was standing in line in [employer], and had onset of dizziness similar to this episode. He passed out while waiting in the line and was taken by EMS to [facility]. He had full workup there including TTE, all of which sounds like it was unremarkable. No prior cardiac hx; no significant PMHx.   Past Medical History:  Diagnosis Date   Arthritis    osteoarthritis- left hip-radiates pain left leg    Past Surgical History:  Procedure Laterality Date   CERVICAL DISC SURGERY     x2   CERVICAL FUSION     Dr. 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   Procedure: LEFT TOTAL HIP ARTHROPLASTY ANTERIOR APPROACH;  Surgeon: Rod Can, MD;  Location: [facility];  Service: Orthopedics;  Laterality: Left;     Home Medications:  No prescription meds Pt takes stool softener and fish oil  PLEASE NOTE: eliquis in home med list, but pt is not currently taking eliquis and says he has never been on eliquis or any other blood thinner  Inpatient Medications: Scheduled Meds:  Continuous Infusions:  PRN Meds:   Allergies:   No Known Allergies  Social History:   Social History   Socioeconomic History   Marital status: Married    Spouse name: Not on file   Number of children: Not on file   Years of education: Not on file   Highest education level: Not on file  Occupational History   Not on file  Tobacco Use   Smoking status: Former    Packs/day: 1.00    Years: 30.00    Total pack years: 30.00    Types: Cigarettes    Quit date: 03/08/2007    Years since quitting: 14.5   Smokeless tobacco: Never  Substance and Sexual Activity   Alcohol use:  Yes    Alcohol/week: 1.0 standard drink of alcohol    Types: 1 Cans of beer per week    Comment: rare social 2-3 per month   Drug use: No   Sexual activity: Not on file  Other Topics Concern   Not on file  Social History Narrative   Not on file   Social Determinants of Health   Financial Resource Strain: Not on file  Food Insecurity: Not on file  Transportation Needs: Not on file  Physical Activity: Not on file  Stress: Not on file  Social Connections: Not on file  Intimate Partner Violence: Not on file    Family History:   No family history on file.   ROS:  Please see the history of present illness.   All other ROS reviewed and negative.     Physical Exam/Data:   Vitals:   10/05/21  0115 10/05/21 0130 10/05/21 0230 10/05/21 0247  BP: 124/69 117/66 104/66   Pulse: 73 72 71   Resp: '19 19 18   '$ Temp:    97.9 F (36.6 C)  TempSrc:    Oral  SpO2: 92% 92% 94%   Weight:      Height:       No intake or output data in the 24 hours ending 10/05/21 0402    10/05/2021   12:00 AM 03/14/2016    6:39 PM 03/14/2016   10:28 AM  Last 3 Weights  Weight (lbs) 190 lb 184 lb 184 lb  Weight (kg) 86.183 kg 83.462 kg 83.462 kg     Body mass index is 27.26 kg/m.  General:  Well nourished, well developed, in no acute distress HEENT: normal Neck: no JVD Vascular: No carotid bruits; Distal pulses 2+ bilaterally Cardiac:  normal S1, S2; RRR; no murmur  Lungs:  clear to auscultation bilaterally, no wheezing, rhonchi or rales  Abd: soft, nontender, no hepatomegaly  Ext: no edema Musculoskeletal:  No deformities Skin: warm and dry  Neuro:  no focal abnormalities noted Psych:  Normal affect   EKG:  The EKG was personally reviewed and demonstrates:  NSR w/o acute ischemic changes. There are two tracings brought by EMS; one that shows ST elevation in the inferior leads, a second one w/ those changes completely resolved. No ST elevation on EKG done in the ED Telemetry:  Telemetry was personally reviewed and demonstrates:  NSR  Relevant CV Studies: none  Laboratory Data:  High Sensitivity Troponin:   Recent Labs  Lab 10/04/21 2149 10/05/21 0121  TROPONINIHS 10 122*     Chemistry Recent Labs  Lab 10/04/21 2149  NA 141  K 3.7  CL 105  CO2 27  GLUCOSE 162*  BUN 14  CREATININE 1.32*  CALCIUM 9.1  GFRNONAA 56*  ANIONGAP 9    Recent Labs  Lab 10/04/21 2149  PROT 6.8  ALBUMIN 3.7  AST 20  ALT 14  ALKPHOS 70  BILITOT 1.0   Lipids No results for input(s): "CHOL", "TRIG", "HDL", "LABVLDL", "LDLCALC", "CHOLHDL" in the last 168 hours.  Hematology Recent Labs  Lab 10/04/21 2149  WBC 8.3  RBC 3.61*  HGB 12.3*  HCT 35.3*  MCV 97.8  MCH 34.1*  MCHC 34.8  RDW 12.9   PLT 174   Thyroid No results for input(s): "TSH", "FREET4" in the last 168 hours.  BNPNo results for input(s): "BNP", "PROBNP" in the last 168 hours.  DDimer No results for input(s): "DDIMER" in the last 168 hours.   Radiology/Studies:  CT  Angio Chest PE W and/or Wo Contrast  Result Date: 10/04/2021 CLINICAL DATA:  Found unresponsive. EXAM: CT ANGIOGRAPHY CHEST WITH CONTRAST TECHNIQUE: Multidetector CT imaging of the chest was performed using the standard protocol during bolus administration of intravenous contrast. Multiplanar CT image reconstructions and MIPs were obtained to evaluate the vascular anatomy. RADIATION DOSE REDUCTION: This exam was performed according to the departmental dose-optimization program which includes automated exposure control, adjustment of the mA and/or kV according to patient size and/or use of iterative reconstruction technique. CONTRAST:  105m OMNIPAQUE IOHEXOL 350 MG/ML SOLN COMPARISON:  None Available. FINDINGS: Cardiovascular: There is mild to moderate severity calcification and atherosclerosis of the aortic arch and descending thoracic aorta, without evidence of aortic aneurysm. Satisfactory opacification of the pulmonary arteries to the segmental level. No evidence of pulmonary embolism. Normal heart size. No pericardial effusion. Mediastinum/Nodes: No enlarged mediastinal, hilar, or axillary lymph nodes. Thyroid gland, trachea, and esophagus demonstrate no significant findings. 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 There is no evidence of acute infiltrate, pleural effusion or pneumothorax. Upper Abdomen: There is a small hiatal hernia. Musculoskeletal: No chest wall abnormality. No acute or significant osseous findings. Review of the MIP images confirms the above findings. IMPRESSION: 1. No evidence of acute pulmonary embolism or other acute intrathoracic process. 2. Small hiatal hernia. Aortic  Atherosclerosis (ICD10-I70.0) and Emphysema (ICD10-J43.9). Electronically Signed   By: TVirgina NorfolkM.D.   On: 10/04/2021 23:28   CT Head Wo Contrast  Result Date: 10/04/2021 CLINICAL DATA:  Found unresponsive. EXAM: CT HEAD WITHOUT CONTRAST TECHNIQUE: Contiguous axial images were obtained from the base of the skull through the vertex without intravenous contrast. RADIATION DOSE REDUCTION: This exam was performed according to the departmental dose-optimization program which includes automated exposure control, adjustment of the mA and/or kV according to patient size and/or use of iterative reconstruction technique. COMPARISON:  None Available. FINDINGS: Brain: There is mild cerebral atrophy with widening of the extra-axial spaces and ventricular dilatation. There are areas of decreased attenuation within the white matter tracts of the supratentorial brain, consistent with microvascular disease changes. Vascular: No hyperdense vessel or unexpected calcification. Skull: Normal. Negative for fracture or focal lesion. Sinuses/Orbits: No acute finding. Other: Mild left parietooccipital scalp soft tissue swelling is seen. IMPRESSION: 1. Mild left parietooccipital scalp soft tissue swelling, without evidence of an acute fracture or acute intracranial abnormality. 2. Generalized cerebral atrophy with chronic white matter small vessel ischemic changes. Electronically Signed   By: TVirgina NorfolkM.D.   On: 10/04/2021 23:25   DG Chest Portable 1 View  Result Date: 10/04/2021 CLINICAL DATA:  Syncope and dyspnea EXAM: PORTABLE CHEST 1 VIEW COMPARISON:  Radiographs 03/14/2004 FINDINGS: Left basilar atelectasis/scarring similar to 03/14/2004. No pleural effusion or pneumothorax. Normal cardiomediastinal silhouette. Aortic calcification. Cervical spine fusion hardware. IMPRESSION: No active disease. Electronically Signed   By: TPlacido SouM.D.   On: 10/04/2021 22:16     Assessment and Plan:   Syncope: unclear  etiology. Pt says he had recent workup including TTE in [place] for a similar episode; we do not have those records, but he says workup there was unremarkable. If ischemia workup here is unremarkable, consider ambulatory monitoring to r/o arrhythmia. Abn trop: he has had no definite anginal sx, and no CP, DOE or other cardiac sx prior to his syncopal episode this evening. The recurrent nature of these recent episodes and the transient inferior ST elevation captured by EMS are concerning despite  overall lack of cardiac complaints. Recommend cycle troponin to see trend. Can put IV heparin on in the meantime. consider [facility] for further evaluation. Would either obtain records re: recent TTE or repeat if records are not available.   Risk Assessment/Risk Scores:     TIMI Risk Score for Unstable Angina or Non-ST Elevation MI:   The patient's TIMI risk score is 2, which indicates a 8% risk of all cause mortality, new or recurrent myocardial infarction or need for urgent revascularization in the next 14 days.        For questions or updates, please contact [facility] Please consult [email or web address] for contact info under    Signed, Rudean Curt, MD, [facility] 10/05/2021 4:02 AM

## 2021-10-05 NOTE — ED Notes (Signed)
Hal Hope MD notified of critical troponin

## 2021-10-05 NOTE — ED Notes (Signed)
Wife called and update given .

## 2021-10-05 NOTE — Progress Notes (Signed)
Echocardiogram 2D Echocardiogram has been performed.  Ronny Flurry 10/05/2021, 9:45 AM

## 2021-10-05 NOTE — Discharge Summary (Signed)
Physician Discharge Summary   Patient: Theodore Hutchinson MRN: [id number] DOB: 09-08-1945  Admit date:     10/04/2021  Discharge date: 10/05/21  Discharge Physician: Vanna Scotland   PCP: [name], MD   Recommendations at discharge:    Event monitor and f/u with Cardiologist Any acute change in symptoms or worsening symptoms will need immediate medical attention Start Aspirin/Statin now and re-evaluate with PCP  Discharge Diagnoses: Principal Problem:   Syncope and collapse Active Problems:   Elevated troponin  Resolved Problems:   * No resolved hospital problems. [facility] Course:  76 y.o. M reportedly no PMH, presented to ER on 8/4 eval of syncope.   Reports similar episode -July 4 [facility] patient had a similar episode went he felt uncomfortable felt dizzy and almost lost consciousness he was taken to the ER and admitted to the hospital for 1 day because of which we do not have access to.  Patient states at that time he also had hit his head had CAT scan of his head 2D echo done and was told that he probably had dehydration.  Assessment and Plan: Syncope ?multifactorial ?Vasovagal, no focal neuro deficits at this time. ?arrhythmia, unremarkable telemetry during hospitalizations Does have slight elevation of troponin with ST elevation-->underwent L heart cath with minimal coronary disease normal EF He has continued to deny anginal symptoms, heparin gtt stopped - TTE normal per cardiologist Obtain orthostatics, check B12/Folate Obtain TSH Cardiology to place holter monitor x 30 days with close interval f/u      Elevated troponin  +initial EKG showing ST elevation  - admitting MD d/w on-call cardiologist Dr. Stevan Born , advised starting patient on heparin infusion, aspirin trending cardiac markers will check 2D echo.   Keep patient NPO except medications.   Acute renal failure   Likely pre renal.  Repeat renal evaluation at outpatient PCP appointment.     Hyperglycemia normalized, A1C pending Anemia we will check anemia panel follow CBC.          Pain control - [employer] Controlled Substance Reporting System database was reviewed. and patient was instructed, not to drive, operate heavy machinery, perform activities at heights, swimming or participation in water activities or provide baby-sitting services while on Pain, Sleep and Anxiety Medications; until their outpatient Physician has advised to do so again. Also recommended to not to take more than prescribed Pain, Sleep and Anxiety Medications.  Consultants: Cardiologist Procedures performed: Cardiac Cath  Disposition: Home Diet recommendation:  Discharge Diet Orders (From admission, onward)     Start     Ordered   10/05/21 0000  Diet - low sodium heart healthy        10/05/21 1437           Cardiac and Carb modified diet DISCHARGE MEDICATION: Allergies as of 10/05/2021   No Known Allergies      Medication List     TAKE these medications    aspirin EC 81 MG tablet Take 1 tablet (81 mg total) by mouth daily. Swallow whole.   atorvastatin 40 MG tablet Commonly known as: Lipitor Take 1 tablet (40 mg total) by mouth daily.   CoQ10 100 MG Caps Take 100 mg by mouth at bedtime.   ELDERBERRY PO Take 3,200 mg by mouth at bedtime.   Omega-3-6-9 Caps Take 3 capsules by mouth at bedtime. Each capsule has 1030 mg Omega 3   TUMS ULTRA 1000 PO Take 2,000 mg by mouth at bedtime.  Follow-up Information     Elouise Munroe, MD Follow up.   Specialties: Cardiology, Radiology Why: office scheduler will contact you to arrange 6-7 weeks follow up Contact information: [place] [address] [phone]         [name], MD Follow up in 1 week(s).   Specialty: Internal Medicine Contact information: [address] [place] [address] [phone]                Discharge Exam: Filed Weights    10/05/21 0000  Weight: 86.2 kg  Eyes: Anicteric no pallor. ENMT: No discharge from the ears eyes nose and mouth. Neck: No mass felt.  No neck rigidity. Respiratory: No rhonchi or crepitations. Cardiovascular: S1-S2 heard. Abdomen: Soft nontender bowel sound present. Musculoskeletal: No edema. Skin: No rash. Neurologic: Alert awake oriented time place and person.  Moves all extremities. Psychiatric: Appears normal.  Normal affect.      Condition at discharge: good  The results of significant diagnostics from this hospitalization (including imaging, microbiology, ancillary and laboratory) are listed below for reference.   Imaging Studies: CARDIAC CATHETERIZATION  Result Date: 10/05/2021   Prox RCA lesion is 20% stenosed.   Mid RCA lesion is 30% stenosed.   Mid LAD lesion is 30% stenosed.   Mid LAD to Dist LAD lesion is 30% stenosed. Mild non-obstructive CAD Normal LV filling pressure Recommendations: No further ischemic workup. Medical management of CAD.   ECHOCARDIOGRAM COMPLETE  Result Date: 10/05/2021    ECHOCARDIOGRAM REPORT   Patient Name:   Theodore Hutchinson Date of Exam: 10/05/2021 Medical Rec #:  [id number]       Height:       70.0 in Accession #:    [id number]      Weight:       190.0 lb Date of Birth:  [birth year]       BSA:          2.042 m Patient Age:    37 years        BP:           108/70 mmHg Patient Gender: M               HR:           67 bpm. Exam Location:  Inpatient Procedure: 2D Echo, Color Doppler and Cardiac Doppler Indications:    Elevated Troponin  History:        Patient has no prior history of Echocardiogram examinations.                 Signs/Symptoms:Syncope.  Sonographer:    Ronny Flurry Referring Phys: [place]  1. Left ventricular ejection fraction, by estimation, is 60 to 65%. The left ventricle has normal function. The left ventricle has no regional wall motion abnormalities. Left ventricular diastolic parameters are consistent with  Grade I diastolic dysfunction (impaired relaxation).  2. Right ventricular systolic function is normal. The right ventricular size is mildly enlarged. Tricuspid regurgitation signal is inadequate for assessing PA pressure.  3. The mitral valve is normal in structure. No evidence of mitral valve regurgitation. No evidence of mitral stenosis.  4. The aortic valve is abnormal. There is moderate calcification of the aortic valve. Aortic valve regurgitation is not visualized. Aortic valve sclerosis/calcification is present, without any evidence of aortic stenosis.  5. The inferior vena cava is dilated in size with >50% respiratory variability, suggesting right atrial pressure of  8 mmHg. FINDINGS  Left Ventricle: Left ventricular ejection fraction, by estimation, is 60 to 65%. The left ventricle has normal function. The left ventricle has no regional wall motion abnormalities. The left ventricular internal cavity size was normal in size. There is  no left ventricular hypertrophy. Left ventricular diastolic parameters are consistent with Grade I diastolic dysfunction (impaired relaxation). Right Ventricle: The right ventricular size is mildly enlarged. No increase in right ventricular wall thickness. Right ventricular systolic function is normal. Tricuspid regurgitation signal is inadequate for assessing PA pressure. The tricuspid regurgitant velocity is 1.12 m/s, and with an assumed right atrial pressure of 8 mmHg, the estimated right ventricular systolic pressure is 64.3 mmHg. Left Atrium: Left atrial size was normal in size. Right Atrium: Right atrial size was normal in size. Pericardium: There is no evidence of pericardial effusion. Mitral Valve: The mitral valve is normal in structure. No evidence of mitral valve regurgitation. No evidence of mitral valve stenosis. Tricuspid Valve: The tricuspid valve is normal in structure. Tricuspid valve regurgitation is trivial. No evidence of tricuspid stenosis. Aortic Valve: The  aortic valve is abnormal. There is moderate calcification of the aortic valve. Aortic valve regurgitation is not visualized. Aortic valve sclerosis/calcification is present, without any evidence of aortic stenosis. Aortic valve mean gradient measures 7.6 mmHg. Aortic valve peak gradient measures 14.2 mmHg. Aortic valve area, by VTI measures 2.45 cm. Pulmonic Valve: The pulmonic valve was not well visualized. Pulmonic valve regurgitation is trivial. No evidence of pulmonic stenosis. Aorta: The aortic root is normal in size and structure. Ascending aorta measurements are within normal limits for age when indexed to body surface area. Venous: The inferior vena cava is dilated in size with greater than 50% respiratory variability, suggesting right atrial pressure of 8 mmHg. IAS/Shunts: No atrial level shunt detected by color flow Doppler.  LEFT VENTRICLE PLAX 2D LVIDd:         5.30 cm   Diastology LVIDs:         3.70 cm   LV e' medial:    8.21 cm/s LV PW:         0.70 cm   LV E/e' medial:  9.3 LV IVS:        0.70 cm   LV e' lateral:   8.33 cm/s LVOT diam:     2.20 cm   LV E/e' lateral: 9.1 LV SV:         115 LV SV Index:   56 LVOT Area:     3.80 cm  RIGHT VENTRICLE RV S prime:     14.00 cm/s TAPSE (M-mode): 2.2 cm LEFT ATRIUM             Index        RIGHT ATRIUM           Index LA diam:        3.70 cm 1.81 cm/m   RA Area:     14.30 cm LA Vol (A2C):   37.9 ml 18.56 ml/m  RA Volume:   32.50 ml  15.91 ml/m LA Vol (A4C):   27.0 ml 13.22 ml/m LA Biplane Vol: 31.9 ml 15.62 ml/m  AORTIC VALVE AV Area (Vmax):    2.46 cm AV Area (Vmean):   2.26 cm AV Area (VTI):     2.45 cm AV Vmax:           188.51 cm/s AV Vmean:          129.393 cm/s AV VTI:  0.469 m AV Peak Grad:      14.2 mmHg AV Mean Grad:      7.6 mmHg LVOT Vmax:         122.00 cm/s LVOT Vmean:        77.000 cm/s LVOT VTI:          0.302 m LVOT/AV VTI ratio: 0.64  AORTA Ao Root diam: 3.70 cm Ao Asc diam:  3.30 cm MITRAL VALVE                TRICUSPID  VALVE MV Area (PHT): 4.49 cm     TR Peak grad:   5.0 mmHg MV Decel Time: 169 msec     TR Vmax:        112.00 cm/s MV E velocity: 76.00 cm/s MV A velocity: 113.00 cm/s  SHUNTS MV E/A ratio:  0.67         Systemic VTI:  0.30 m                             Systemic Diam: 2.20 cm Cherlynn Kaiser MD Electronically signed by Cherlynn Kaiser MD Signature Date/Time: 10/05/2021/10:33:54 AM    Final    CT Angio Chest PE W and/or Wo Contrast  Result Date: 10/04/2021 CLINICAL DATA:  Found unresponsive. EXAM: CT ANGIOGRAPHY CHEST WITH CONTRAST TECHNIQUE: Multidetector CT imaging of the chest was performed using the standard protocol during bolus administration of intravenous contrast. Multiplanar CT image reconstructions and MIPs were obtained to evaluate the vascular anatomy. RADIATION DOSE REDUCTION: This exam was performed according to the departmental dose-optimization program which includes automated exposure control, adjustment of the mA and/or kV according to patient size and/or use of iterative reconstruction technique. CONTRAST:  58m OMNIPAQUE IOHEXOL 350 MG/ML SOLN COMPARISON:  None Available. FINDINGS: Cardiovascular: There is mild to moderate severity calcification and atherosclerosis of the aortic arch and descending thoracic aorta, without evidence of aortic aneurysm. Satisfactory opacification of the pulmonary arteries to the segmental level. No evidence of pulmonary embolism. Normal heart size. No pericardial effusion. Mediastinum/Nodes: No enlarged mediastinal, hilar, or axillary lymph nodes. Thyroid gland, trachea, and esophagus demonstrate no significant findings. 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 There is no evidence of acute infiltrate, pleural effusion or pneumothorax. Upper Abdomen: There is a small hiatal hernia. Musculoskeletal: No chest wall abnormality. No acute or significant osseous findings. Review of the MIP images confirms  the above findings. IMPRESSION: 1. No evidence of acute pulmonary embolism or other acute intrathoracic process. 2. Small hiatal hernia. Aortic Atherosclerosis (ICD10-I70.0) and Emphysema (ICD10-J43.9). Electronically Signed   By: TVirgina NorfolkM.D.   On: 10/04/2021 23:28   CT Head Wo Contrast  Result Date: 10/04/2021 CLINICAL DATA:  Found unresponsive. EXAM: CT HEAD WITHOUT CONTRAST TECHNIQUE: Contiguous axial images were obtained from the base of the skull through the vertex without intravenous contrast. RADIATION DOSE REDUCTION: This exam was performed according to the departmental dose-optimization program which includes automated exposure control, adjustment of the mA and/or kV according to patient size and/or use of iterative reconstruction technique. COMPARISON:  None Available. FINDINGS: Brain: There is mild cerebral atrophy with widening of the extra-axial spaces and ventricular dilatation. There are areas of decreased attenuation within the white matter tracts of the supratentorial brain, consistent with microvascular disease changes. Vascular: No hyperdense vessel or unexpected calcification. Skull: Normal. Negative for fracture or focal lesion. Sinuses/Orbits: No acute finding. Other: Mild  left parietooccipital scalp soft tissue swelling is seen. IMPRESSION: 1. Mild left parietooccipital scalp soft tissue swelling, without evidence of an acute fracture or acute intracranial abnormality. 2. Generalized cerebral atrophy with chronic white matter small vessel ischemic changes. Electronically Signed   By: Virgina Norfolk M.D.   On: 10/04/2021 23:25   DG Chest Portable 1 View  Result Date: 10/04/2021 CLINICAL DATA:  Syncope and dyspnea EXAM: PORTABLE CHEST 1 VIEW COMPARISON:  Radiographs 03/14/2004 FINDINGS: Left basilar atelectasis/scarring similar to 03/14/2004. No pleural effusion or pneumothorax. Normal cardiomediastinal silhouette. Aortic calcification. Cervical spine fusion hardware.  IMPRESSION: No active disease. Electronically Signed   By: Placido Sou M.D.   On: 10/04/2021 22:16    Microbiology: Results for orders placed or performed during the hospital encounter of 03/07/16  Surgical pcr screen     Status: None   Collection Time: 03/07/16  8:50 AM   Specimen: Vein; Nasal Swab  Result Value Ref Range Status   MRSA, PCR NEGATIVE NEGATIVE Final   Staphylococcus aureus NEGATIVE NEGATIVE Final    Comment:        The Xpert SA Assay (FDA approved for NASAL specimens in patients over 75 years of age), is one component of a comprehensive surveillance program.  Test performance has been validated by [facility] for patients greater than or equal to 23 year old. It is not intended to diagnose infection nor to guide or monitor treatment.     Labs: CBC: Recent Labs  Lab 10/04/21 2149 10/05/21 0417  WBC 8.3 12.4*  NEUTROABS 5.9 10.4*  HGB 12.3* 12.0*  HCT 35.3* 33.7*  MCV 97.8 94.9  PLT 174 330   Basic Metabolic Panel: Recent Labs  Lab 10/04/21 2149 10/05/21 0417  NA 141 139  K 3.7 4.4  CL 105 106  CO2 27 26  GLUCOSE 162* 123*  BUN 14 15  CREATININE 1.32* 1.18  CALCIUM 9.1 8.9   Liver Function Tests: Recent Labs  Lab 10/04/21 2149 10/05/21 0417  AST 20 17  ALT 14 14  ALKPHOS 70 61  BILITOT 1.0 0.7  PROT 6.8 6.4*  ALBUMIN 3.7 3.3*   CBG: Recent Labs  Lab 10/04/21 2154  GLUCAP 172*    Discharge time spent: less than 30 minutes.  Signed: Vanna Scotland, MD [facility] 10/05/2021

## 2021-10-06 DIAGNOSIS — R55 Syncope and collapse: Secondary | ICD-10-CM | POA: Diagnosis not present

## 2021-10-08 ENCOUNTER — Encounter (HOSPITAL_COMMUNITY): Payer: Self-pay | Admitting: Cardiovascular Disease

## 2021-12-20 ENCOUNTER — Other Ambulatory Visit: Payer: Self-pay | Admitting: Cardiology

## 2021-12-28 ENCOUNTER — Ambulatory Visit: Payer: PPO | Attending: Cardiology | Admitting: Cardiology

## 2021-12-28 ENCOUNTER — Encounter: Payer: Self-pay | Admitting: Cardiology

## 2021-12-28 VITALS — BP 128/64 | HR 64 | Ht 70.0 in | Wt 168.8 lb

## 2021-12-28 DIAGNOSIS — Z87898 Personal history of other specified conditions: Secondary | ICD-10-CM | POA: Diagnosis not present

## 2021-12-28 DIAGNOSIS — I2510 Atherosclerotic heart disease of native coronary artery without angina pectoris: Secondary | ICD-10-CM

## 2021-12-28 DIAGNOSIS — I455 Other specified heart block: Secondary | ICD-10-CM

## 2021-12-28 MED ORDER — ROSUVASTATIN CALCIUM 5 MG PO TABS: 5.0000 mg | ORAL_TABLET | Freq: Every day | ORAL | 1 refills | Status: DC

## 2021-12-28 NOTE — Progress Notes (Signed)
Cardiology Office Note  Date: 12/28/2021   ID: Sacha, Radloff 1945/04/17, MRN [id number]  PCP:  [name], MD  Cardiologist:  Rozann Lesches, MD Electrophysiologist:  None   Chief Complaint  Patient presents with   Cardiac follow-up    History of Present Illness: Theodore Hutchinson is a 76 y.o. male presenting for a follow-up visit. This is my first meeting with him, I reviewed extensive records and updated the chart.  He was hospitalized back in August for evaluation of a syncopal event.  No obvious acute etiology was uncovered during hospital evaluation.  He did have mild elevation in high-sensitivity troponin I peaking at 143 and was seen by cardiology, underwent cardiac catheterization by Dr. Angelena Form demonstrating mild nonobstructive CAD.  Echocardiogram demonstrated normal LVEF at 60 to 65%.    Subsequent outpatient cardiac monitor arranged by Dr. Margaretann Loveless showed sinus rhythm with heart rate ranging from 37 bpm up to 98 bpm and average heart rate 62 bpm.  There were rare PACs and PVCs, one very brief run of SVT lasting only 4 beats, and also a 3-second pause during period of bradycardia that occurred at 8:40 PM on August 9 and was not a patient triggered episode.  He is here today with his wife.  States that he feels well, has not had any significant events since August.  We went back over his symptoms.  He actually had experienced an episode of syncope while standing in a line at a grocery store back in July when he was at [employer].  He felt no prodrome, suddenly became dizzy and blacked out.  The episode that occurred in August was actually while he was sitting in a recliner, suddenly felt dizzy and passed out, witnessed by his wife.  I reviewed his medications.  He was discharged on Lipitor back in August but did not take the medication.  He previously had intolerance to Lipitor.  We discussed rationale for statin therapy in the setting of atherosclerosis and we will try  low-dose Crestor instead.  Over the results of his monitor and discussed his symptoms.  It is possible that he has intermittent bradycardia or pauses as an etiology.  I recommended EP consultation for discussion of ILR.  Past Medical History:  Diagnosis Date   Mild CAD    Cardiac catheterization August 2023   Mixed hyperlipidemia    Osteoarthritis     Past Surgical History:  Procedure Laterality Date   CERVICAL DISC SURGERY     x2   CERVICAL FUSION     Dr. Patrice Paradise 1'06 [facility]   HERNIA REPAIR Left    open LIH/mesh 7''16   LEFT HEART CATH AND CORONARY ANGIOGRAPHY N/A 10/05/2021   Procedure: LEFT HEART CATH AND CORONARY ANGIOGRAPHY;  Surgeon: Burnell Blanks, MD;  Location: [facility];  Service: Cardiovascular;  Laterality: N/A;   SHOULDER ARTHROSCOPY W/ ROTATOR CUFF REPAIR Right    stenosis intestines Right    age 72 "pyloric stenosis"   TOTAL HIP ARTHROPLASTY Left 03/14/2016   Procedure: LEFT TOTAL HIP ARTHROPLASTY ANTERIOR APPROACH;  Surgeon: Rod Can, MD;  Location: [facility];  Service: Orthopedics;  Laterality: Left;    Current Outpatient Medications  Medication Sig Dispense Refill   Calcium Carbonate Antacid (TUMS ULTRA 1000 PO) Take 2,000 mg by mouth at bedtime.     Coenzyme Q10 (COQ10) 100 MG CAPS Take 100 mg by mouth at bedtime.     ELDERBERRY PO Take 3,200 mg  by mouth as needed.     Omega-3 Fatty Acids (FISH OIL PO) Take 3 tablets by mouth at bedtime.     rosuvastatin (CRESTOR) 5 MG tablet Take 1 tablet (5 mg total) by mouth daily. 90 tablet 1   No current facility-administered medications for this visit.   Allergies:  Patient has no known allergies.   Social History: The patient  reports that he quit smoking about 14 years ago. His smoking use included cigarettes. He has a 30.00 pack-year smoking history. He has never used smokeless tobacco. He reports current alcohol use of about 1.0 standard drink of alcohol per week. He reports that he does not use  drugs.   Family History: The patient's family history includes Alzheimer's disease in his father.   ROS: No orthopnea or PND.  Physical Exam: VS:  BP 128/64   Pulse 64   Ht '5\' 10"'$  (1.778 m)   Wt 168 lb 12.8 oz (76.6 kg)   SpO2 95%   BMI 24.22 kg/m , BMI Body mass index is 24.22 kg/m.  Wt Readings from Last 3 Encounters:  12/28/21 168 lb 12.8 oz (76.6 kg)  10/05/21 190 lb (86.2 kg)  03/14/16 184 lb (83.5 kg)    General: Patient appears comfortable at rest. HEENT: Conjunctiva and lids normal. Neck: Supple, no elevated JVP or carotid bruits. Lungs: Clear to auscultation, nonlabored breathing at rest. Cardiac: Regular rate and rhythm, no S3 or significant systolic murmur, no pericardial rub. Abdomen: Soft, nontender, bowel sounds present, no guarding or rebound. Extremities: No pitting edema, distal pulses 2+. Skin: Warm and dry. Musculoskeletal: No kyphosis. Neuropsychiatric: Alert and oriented x3, affect grossly appropriate.  ECG:  An ECG dated 10/04/2021 was personally reviewed today and demonstrated:  Sinus rhythm with nonspecific ST changes.  Recent Labwork: 10/05/2021: ALT 14; AST 17; BUN 15; Creatinine, Ser 1.18; Hemoglobin 11.9; Platelets 161; Potassium 4.4; Sodium 139; TSH 1.049     Component Value Date/Time   CHOL 170 10/05/2021 0417   TRIG 58 10/05/2021 0417   HDL 34 (L) 10/05/2021 0417   CHOLHDL 5.0 10/05/2021 0417   VLDL 12 10/05/2021 0417   LDLCALC 124 (H) 10/05/2021 0417    Other Studies Reviewed Today:  Cardiac catheterization 10/05/2021:   Prox RCA lesion is 20% stenosed.   Mid RCA lesion is 30% stenosed.   Mid LAD lesion is 30% stenosed.   Mid LAD to Dist LAD lesion is 30% stenosed.   Mild non-obstructive CAD Normal LV filling pressure   Recommendations: No further ischemic workup. Medical management of CAD.   Echocardiogram 10/05/2021:  1. Left ventricular ejection fraction, by estimation, is 60 to 65%. The  left ventricle has normal function. The  left ventricle has no regional  wall motion abnormalities. Left ventricular diastolic parameters are  consistent with Grade I diastolic  dysfunction (impaired relaxation).   2. Right ventricular systolic function is normal. The right ventricular  size is mildly enlarged. Tricuspid regurgitation signal is inadequate for  assessing PA pressure.   3. The mitral valve is normal in structure. No evidence of mitral valve  regurgitation. No evidence of mitral stenosis.   4. The aortic valve is abnormal. There is moderate calcification of the  aortic valve. Aortic valve regurgitation is not visualized. Aortic valve  sclerosis/calcification is present, without any evidence of aortic  stenosis.   5. The inferior vena cava is dilated in size with >50% respiratory  variability, suggesting right atrial pressure of 8 mmHg.   Assessment and  Plan:  1.  History of syncope, episodes in July and August as noted above.  Does not sound vasovagal based on description, no associated palpitations or obvious prodrome.  Cardiac work-up in August revealed normal LVEF and only mild CAD, no clear evidence of ACS.  Subsequent outpatient heart monitor does show an episode of bradycardia with 3-second pause, not entirely clear that this was symptom provoking or not, occurred at 8:40 PM on August 9.  Plan at this point is EP referral for consideration of ILR.  2.  Mild coronary artery disease by cardiac catheterization in August, LVEF normal.  Discussed rationale for statin therapy and will start Crestor 5 mg daily for now.  LDL was 124 in August, recheck FLP for next visit.  He reports prior intolerance to Lipitor.  Medication Adjustments/Labs and Tests Ordered: Current medicines are reviewed at length with the patient today.  Concerns regarding medicines are outlined above.   Tests Ordered: Orders Placed This Encounter  Procedures   Lipid panel   Ambulatory referral to Cardiac Electrophysiology    Medication  Changes: Meds ordered this encounter  Medications   rosuvastatin (CRESTOR) 5 MG tablet    Sig: Take 1 tablet (5 mg total) by mouth daily.    Dispense:  90 tablet    Refill:  1    12/28/21 New Start    Disposition:  Follow up  6 months.  Signed, Satira Sark, MD, [facility] 12/28/2021 3:01 PM    [facility] [place], [place], [place] [address] Phone: [phone]; Fax: [phone]

## 2021-12-28 NOTE — Patient Instructions (Addendum)
Medication Instructions:  Your physician has recommended you make the following change in your medication:  Start Crestor 5 mg once a day Continue all other medications as directed  Labwork: Lipids ([facility] 1-2 weeks before next scheduled appointment)  Testing/Procedures: none  Follow-Up:  Your physician recommends that you schedule a follow-up appointment in: 6 months  Any Other Special Instructions Will Be Listed Below (If Applicable).  You have been referred to Electrophysiology   If you need a refill on your cardiac medications before your next appointment, please call your pharmacy.

## 2022-01-16 DIAGNOSIS — L570 Actinic keratosis: Secondary | ICD-10-CM | POA: Diagnosis not present

## 2022-01-17 ENCOUNTER — Ambulatory Visit: Payer: PPO | Attending: Cardiovascular Disease | Admitting: Cardiovascular Disease

## 2022-01-17 ENCOUNTER — Encounter: Payer: Self-pay | Admitting: Cardiovascular Disease

## 2022-01-17 VITALS — BP 124/66 | HR 60 | Ht 70.0 in | Wt 169.8 lb

## 2022-01-17 DIAGNOSIS — R55 Syncope and collapse: Secondary | ICD-10-CM | POA: Diagnosis not present

## 2022-01-17 NOTE — Progress Notes (Signed)
Electrophysiology Office Note:    Date:  01/17/2022   ID:  Theodore Hutchinson, DOB April 04, 1945, MRN [id number]  PCP:  [name], MD   [facility] Cardiologist:  Rozann Lesches, MD Electrophysiologist:  Melida Quitter, MD     Referring MD: [name], MD   Chief complaint: passing out  History of Present Illness:    Theodore Hutchinson is a 76 y.o. male with a hx of mild CAD and recurrent syncope.  This is my first time seeing him. He has a number of ER visits and clinic visits in our records which I have reviewed.   Briefly, the patient has had multiple syncopal episodes. These have occurred in a variety of settings - one in a store, another at a restaurant, one occurred while he was sitting.    Past Medical History:  Diagnosis Date   Mild CAD    Cardiac catheterization August 2023   Mixed hyperlipidemia    Osteoarthritis     Past Surgical History:  Procedure Laterality Date   CERVICAL DISC SURGERY     x2   CERVICAL FUSION     Dr. 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   Omega-3 Fatty Acids (FISH OIL PO) Take 3 tablets by mouth at bedtime.   rosuvastatin (CRESTOR) 5 MG tablet Take 1 tablet (5 mg total) by mouth daily.     Allergies:   Patient has no known  allergies.   Social History   Socioeconomic History   Marital status: Married    Spouse name: Not on file   Number of children: Not on file   Years of education: Not on file   Highest education level: Not on file  Occupational History   Not on file  Tobacco Use   Smoking status: Former    Packs/day: 1.00    Years: 30.00    Total pack years: 30.00    Types: Cigarettes    Quit date: 03/08/2007    Years since quitting: 14.8   Smokeless tobacco: Never  Substance and Sexual Activity   Alcohol use: Yes    Alcohol/week: 1.0 standard drink of alcohol    Types: 1 Cans of beer per week    Comment: rare social 2-3 per month   Drug use: No   Sexual activity: Not on file  Other Topics Concern   Not on file  Social History Narrative   Not on file   Social Determinants of Health   Financial Resource Strain: Not on file  Food Insecurity: Not on file  Transportation Needs: Not on file  Physical Activity: Not on file  Stress: Not on file  Social Connections: Not on file     Family History: The patient's family history includes Alzheimer's disease  in his father.  ROS:   Please see the history of present illness.    All other systems reviewed and are negative.  EKGs/Labs/Other Studies Reviewed Today:    Cardiac monitor Patient had a min HR of 37 bpm, max HR of 133 bpm, and avg HR of 62 bpm. Predominant underlying rhythm was Sinus Rhythm. 1 run of Supraventricular Tachycardia occurred lasting 4 beats with a max rate of 133 bpm (avg 119 bpm). 1 Pause occurred lasting 3  secs (20 bpm). Isolated SVEs were rare (<1.0%), SVE Couplets were rare (<1.0%), and SVE Triplets were rare (<1.0%). Isolated VEs were rare (<1.0%), and no VE Couplets or VE Triplets were present.   EKG:  Last EKG results: today - sinus rhythm   Recent Labs: 10/05/2021: ALT 14; BUN 15; Creatinine, Ser 1.18; Hemoglobin 11.9; Platelets 161; Potassium 4.4; Sodium 139; TSH 1.049     Physical Exam:    VS:  BP 124/66    Pulse 60   Ht '5\' 10"'$  (1.778 m)   Wt 169 lb 12.8 oz (77 kg)   SpO2 96%   BMI 24.36 kg/m     Wt Readings from Last 3 Encounters:  01/17/22 169 lb 12.8 oz (77 kg)  12/28/21 168 lb 12.8 oz (76.6 kg)  10/05/21 190 lb (86.2 kg)     GEN: Well nourished, well developed in no acute distress CARDIAC: RRR, no murmurs, rubs, gallops RESPIRATORY:  Normal work of breathing MUSCULOSKELETAL: no edema    ASSESSMENT & PLAN:    Recurrent syncope: wearable monitor was abnormal and showed one brief pause but no definitive cause of syncope or arrhythmia for which pacemaker would benefit. I agree loop recorder is reasonable to monitor closely for a possible cause of syncope.          DESCRIPTION OF PROCEDURE:  Informed written consent was obtained.  The patient required no sedation for the procedure today.  The patients left chest was prepped and draped in the usual sterile fashion. The skin overlying the left parasternal region was infiltrated with lidocaine for local analgesia.  A 0.5-cm incision was made over the left parasternal region over the 3rd intercostal space.  An Abbott implantable loop recorder was then placed into the pocket  R waves were very prominent and measured >0.82m.  Steri- Strips and a sterile dressing were then applied.  There were no early apparent complications.     CONCLUSIONS:   1. 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 No early apparent complications.   AMelida Quitter MD  Cardiac Electrophysiology    Medication Adjustments/Labs and Tests Ordered: Current medicines are reviewed at length with the patient today.  Concerns regarding medicines are outlined above.  No orders of the defined types were placed in this encounter.  No orders of the defined types were placed in this encounter.    Signed, AMelida Quitter MD  01/17/2022 11:29 AM    [facility]

## 2022-01-17 NOTE — Patient Instructions (Signed)
Medication Instructions:  Your physician recommends that you continue on your current medications as directed. Please refer to the Current Medication list given to you today.  Labwork: None ordered.  Testing/Procedures: None ordered.  Follow-Up:  Your physician wants you to follow-up in: one year with Dr. Myles Gip.  You will receive a reminder letter in the mail two months in advance. If you don't receive a letter, please call our office to schedule the follow-up appointment.    Implantable Loop Recorder Placement, Care After This sheet gives you information about how to care for yourself after your procedure. Your health care provider may also give you more specific instructions. If you have problems or questions, contact your health care provider. What can I expect after the procedure? After the procedure, it is common to have: Soreness or discomfort near the incision. Some swelling or bruising near the incision.  Follow these instructions at home: Incision care  Monitor your cardiac device site for redness, swelling, and drainage. Call the device clinic at [phone] if you experience these symptoms or fever/chills.  Keep the large square bandage on your site for 24 hours and then you may remove it yourself. Keep the steri-strips underneath in place.   You may shower after 72 hours / 3 days from your procedure with the steri-strips in place. They will usually fall off on their own, or may be removed after 10 days. Pat dry.   Avoid lotions, ointments, or perfumes over your incision until it is well-healed.  Please do not submerge in water until your site is completely healed.   Your device is MRI compatible.   Remote monitoring is used to monitor your cardiac device from home. This monitoring is scheduled every month by our office. It allows [place] to keep an eye on the function of your device to ensure it is working properly.  If your wound site starts to bleed apply pressure.     For help with the monitor please call [employer] Monitor Support Specialist directly at [phone].    If you have any questions/concerns please call the device clinic at [phone].  Activity  Return to your normal activities.  General instructions Follow instructions from your health care provider about how to manage your implantable loop recorder and transmit the information. Learn how to activate a recording if this is necessary for your type of device. You may go through a metal detection gate, and you may let someone hold a metal detector over your chest. Show your ID card if needed. Do not have an MRI unless you check with your health care provider first. Take over-the-counter and prescription medicines only as told by your health care provider. Keep all follow-up visits as told by your health care provider. This is important. Contact a health care provider if: You have redness, swelling, or pain around your incision. You have a fever. You have pain that is not relieved by your pain medicine. You have triggered your device because of fainting (syncope) or because of a heartbeat that feels like it is racing, slow, fluttering, or skipping (palpitations). Get help right away if you have: Chest pain. Difficulty breathing. Summary After the procedure, it is common to have soreness or discomfort near the incision. Change your dressing as told by your health care provider. Follow instructions from your health care provider about how to manage your implantable loop recorder and transmit the information. Keep all follow-up visits as told by your health care provider. This is important. This information  is not intended to replace advice given to you by your health care provider. Make sure you discuss any questions you have with your health care provider. Document Released: 01/30/2015 Document Revised: 04/05/2017 Document Reviewed: 04/05/2017 Elsevier Patient Education  2020 [employer].

## 2022-02-06 ENCOUNTER — Inpatient Hospital Stay (HOSPITAL_COMMUNITY)
Admission: EM | Admit: 2022-02-06 | Discharge: 2022-02-09 | DRG: 244 | Disposition: A | Discharge status: Home / Self Care | Payer: PPO | Attending: Cardiology | Admitting: Cardiology

## 2022-02-06 ENCOUNTER — Other Ambulatory Visit: Payer: Self-pay

## 2022-02-06 ENCOUNTER — Encounter (HOSPITAL_COMMUNITY): Payer: Self-pay

## 2022-02-06 ENCOUNTER — Emergency Department (HOSPITAL_COMMUNITY): Payer: PPO

## 2022-02-06 DIAGNOSIS — I2510 Atherosclerotic heart disease of native coronary artery without angina pectoris: Secondary | ICD-10-CM | POA: Diagnosis present

## 2022-02-06 DIAGNOSIS — R001 Bradycardia, unspecified: Secondary | ICD-10-CM | POA: Insufficient documentation

## 2022-02-06 DIAGNOSIS — E860 Dehydration: Secondary | ICD-10-CM | POA: Diagnosis present

## 2022-02-06 DIAGNOSIS — Z4509 Encounter for adjustment and management of other cardiac device: Secondary | ICD-10-CM | POA: Diagnosis not present

## 2022-02-06 DIAGNOSIS — E785 Hyperlipidemia, unspecified: Secondary | ICD-10-CM | POA: Insufficient documentation

## 2022-02-06 DIAGNOSIS — Z981 Arthrodesis status: Secondary | ICD-10-CM | POA: Diagnosis not present

## 2022-02-06 DIAGNOSIS — R7989 Other specified abnormal findings of blood chemistry: Secondary | ICD-10-CM | POA: Diagnosis present

## 2022-02-06 DIAGNOSIS — I471 Supraventricular tachycardia, unspecified: Secondary | ICD-10-CM | POA: Diagnosis present

## 2022-02-06 DIAGNOSIS — D649 Anemia, unspecified: Secondary | ICD-10-CM | POA: Diagnosis present

## 2022-02-06 DIAGNOSIS — Z950 Presence of cardiac pacemaker: Secondary | ICD-10-CM | POA: Diagnosis not present

## 2022-02-06 DIAGNOSIS — J439 Emphysema, unspecified: Secondary | ICD-10-CM | POA: Diagnosis not present

## 2022-02-06 DIAGNOSIS — Z96642 Presence of left artificial hip joint: Secondary | ICD-10-CM | POA: Diagnosis present

## 2022-02-06 DIAGNOSIS — I459 Conduction disorder, unspecified: Principal | ICD-10-CM | POA: Diagnosis present

## 2022-02-06 DIAGNOSIS — R1111 Vomiting without nausea: Secondary | ICD-10-CM | POA: Diagnosis not present

## 2022-02-06 DIAGNOSIS — R55 Syncope and collapse: Secondary | ICD-10-CM | POA: Diagnosis not present

## 2022-02-06 DIAGNOSIS — R946 Abnormal results of thyroid function studies: Secondary | ICD-10-CM | POA: Diagnosis present

## 2022-02-06 DIAGNOSIS — Z87891 Personal history of nicotine dependence: Secondary | ICD-10-CM

## 2022-02-06 DIAGNOSIS — R61 Generalized hyperhidrosis: Secondary | ICD-10-CM | POA: Diagnosis not present

## 2022-02-06 DIAGNOSIS — I455 Other specified heart block: Secondary | ICD-10-CM | POA: Diagnosis present

## 2022-02-06 DIAGNOSIS — E782 Mixed hyperlipidemia: Secondary | ICD-10-CM | POA: Diagnosis present

## 2022-02-06 DIAGNOSIS — I442 Atrioventricular block, complete: Secondary | ICD-10-CM | POA: Diagnosis not present

## 2022-02-06 DIAGNOSIS — Z79899 Other long term (current) drug therapy: Secondary | ICD-10-CM | POA: Diagnosis not present

## 2022-02-06 DIAGNOSIS — D72829 Elevated white blood cell count, unspecified: Secondary | ICD-10-CM | POA: Diagnosis present

## 2022-02-06 DIAGNOSIS — R569 Unspecified convulsions: Secondary | ICD-10-CM | POA: Diagnosis not present

## 2022-02-06 LAB — CBC WITH DIFFERENTIAL/PLATELET
Abs Immature Granulocytes: 0.06 10*3/uL (ref 0.00–0.07)
Basophils Absolute: 0 10*3/uL (ref 0.0–0.1)
Basophils Relative: 0 %
Eosinophils Absolute: 0.1 10*3/uL (ref 0.0–0.5)
Eosinophils Relative: 0 %
HCT: 37.2 % — ABNORMAL LOW (ref 39.0–52.0)
Hemoglobin: 12.5 g/dL — ABNORMAL LOW (ref 13.0–17.0)
Immature Granulocytes: 1 %
Lymphocytes Relative: 10 %
Lymphs Abs: 1.2 10*3/uL (ref 0.7–4.0)
MCH: 31.8 pg (ref 26.0–34.0)
MCHC: 33.6 g/dL (ref 30.0–36.0)
MCV: 94.7 fL (ref 80.0–100.0)
Monocytes Absolute: 1 10*3/uL (ref 0.1–1.0)
Monocytes Relative: 8 %
Neutro Abs: 9.9 10*3/uL — ABNORMAL HIGH (ref 1.7–7.7)
Neutrophils Relative %: 81 %
Platelets: 189 10*3/uL (ref 150–400)
RBC: 3.93 MIL/uL — ABNORMAL LOW (ref 4.22–5.81)
RDW: 13 % (ref 11.5–15.5)
WBC: 12.2 10*3/uL — ABNORMAL HIGH (ref 4.0–10.5)
nRBC: 0 % (ref 0.0–0.2)

## 2022-02-06 NOTE — ED Provider Notes (Signed)
[facility] EMERGENCY DEPARTMENT Provider Note   [name]: [id number] Arrival date & time: 02/06/22  2117     History  Chief Complaint  Patient presents with   Loss of Consciousness   Emesis    Theodore Hutchinson is a 76 y.o. male.   Loss of Consciousness Associated symptoms: vomiting   Emesis    This patient is a 76 year old male, he presents to the hospital after having what appears to be some type of near syncopal episode at home.  He arrives by paramedic transport.  Evidently the patient was sitting in his recliner, he felt very dizzy and hot, he vomited on himself and that according to family members he had passed out in the chair.  There was sinus bradycardia and route to the hospital today with a heart rate of in the 50s.  He is now feeling like he is back to his normal self and reporting that this has been happening somewhat frequently, he is actually had a monitor placed by cardiology  Progress notes from the cardiology office by Dr. Domenic Polite reports that he is home for the first time on November 16 for multiple syncopal episodes he had a heart catheterization in August 2023 which showed mild coronary disease, no interventions, hyperlipidemia, the patient had a cardiac monitor, the patient had a cardiac monitor, minimum heart rate of 37 bpm with a maximum of 133 sinus rhythm was the underlying rhythm.  There was one 1 run of SVT that was only 4 beats long he had 1/22 pause  Loop recorder was placed, this is an Abbott implantable loop recorder.  Home Medications Prior to Admission medications   Medication Sig Start Date End Date Taking? Authorizing Provider  Calcium Carbonate Antacid (TUMS ULTRA 1000 PO) Take 2,000 mg by mouth at bedtime.    [provider]  ELDERBERRY PO Take 3,200 mg by mouth as needed.    [provider]  Omega-3 Fatty Acids (FISH OIL PO) Take 3 tablets by mouth at bedtime.    [provider]  rosuvastatin (CRESTOR) 5 MG tablet Take 1  tablet (5 mg total) by mouth daily. 12/28/21 06/26/22  Satira Sark, MD      Allergies    Patient has no known allergies.    Review of Systems   Review of Systems  Cardiovascular:  Positive for syncope.  Gastrointestinal:  Positive for vomiting.  All other systems reviewed and are negative.   Physical Exam Updated Vital Signs BP 139/77   Pulse (!) 54   Temp 97.7 F (36.5 C) (Oral)   Resp 15   Wt 77 kg   SpO2 97%   BMI 24.36 kg/m  Physical Exam Vitals and nursing note reviewed.  Constitutional:      General: He is not in acute distress.    Appearance: He is well-developed.  HENT:     Head: Normocephalic and atraumatic.     Mouth/Throat:     Pharynx: No oropharyngeal exudate.  Eyes:     General: No scleral icterus.       Right eye: No discharge.        Left eye: No discharge.     Conjunctiva/sclera: Conjunctivae normal.     Pupils: Pupils are equal, round, and reactive to light.  Neck:     Thyroid: No thyromegaly.     Vascular: No JVD.  Cardiovascular:     Rate and Rhythm: Normal rate and regular rhythm.     Heart sounds: Normal heart  sounds. No murmur heard.    No friction rub. No gallop.  Pulmonary:     Effort: Pulmonary effort is normal. No respiratory distress.     Breath sounds: Normal breath sounds. No wheezing or rales.  Abdominal:     General: Bowel sounds are normal. There is no distension.     Palpations: Abdomen is soft. There is no mass.     Tenderness: There is no abdominal tenderness.  Musculoskeletal:        General: No tenderness. Normal range of motion.     Cervical back: Normal range of motion and neck supple.  Lymphadenopathy:     Cervical: No cervical adenopathy.  Skin:    General: Skin is warm and dry.     Findings: No erythema or rash.  Neurological:     Mental Status: He is alert.     Coordination: Coordination normal.     Comments: Speech is clear, cranial nerves III through XII are intact, memory is intact, strength is normal  in all 4 extremities including grips, sensation is intact to light touch and pinprick in all 4 extremities. Coordination as tested by finger-nose-finger is normal, no limb ataxia. Normal gait, normal reflexes at the patellar tendons bilaterally  Psychiatric:        Behavior: Behavior normal.     ED Results / Procedures / Treatments   Labs (all labs ordered are listed, but only abnormal results are displayed) Labs Reviewed  CBC WITH DIFFERENTIAL/PLATELET - Abnormal; Notable for the following components:      Result Value   WBC 12.2 (*)    RBC 3.93 (*)    Hemoglobin 12.5 (*)    HCT 37.2 (*)    Neutro Abs 9.9 (*)    All other components within normal limits  COMPREHENSIVE METABOLIC PANEL  TROPONIN I (HIGH SENSITIVITY)    EKG EKG Interpretation  Date/Time:  Wednesday February 06 2022 22:28:43 EST Ventricular Rate:  55 PR Interval:  182 QRS Duration: 114 QT Interval:  466 QTC Calculation: 446 R Axis:   69 Text Interpretation: Sinus rhythm Probable anteroseptal infarct, old since last tracing no significant change Confirmed by Noemi Chapel [phone]) on 02/06/2022 10:38:05 PM  Radiology No results found.  Procedures Procedures    Medications Ordered in ED Medications - No data to display  ED Course/ Medical Decision Making/ A&P                           Medical Decision Making Amount and/or Complexity of Data Reviewed Labs: ordered. ECG/medicine tests: ordered.   This patient presents to the ED for concern of syncope with bradycardia / vomitin / altered MS., this involves an extensive number of treatment options, and is a complaint that carries with it a high risk of complications and morbidity.  The differential diagnosis includes arrhythmia, seizures, sepsis / stroke   Co morbidities that complicate the patient evaluation  Syncope cholesterol   Additional history obtained:  Additional history obtained from medical records and office visits External records  from outside source obtained and reviewed including recent placement of loop recorder   Lab Tests:  I Ordered, and personally interpreted labs.  The pertinent results include:  WBC and Hgb slightly abnormal - CMP pending at the time of change of shift   Imaging Studies ordered:  I ordered imaging studies including chest portable  At time of change of shift - pending xray read I agree with the radiologist  interpretation   Cardiac Monitoring: / EKG:  The patient was maintained on a cardiac monitor.  I personally viewed and interpreted the cardiac monitored which showed an underlying rhythm of: Sinus bradycardia   Consultations Obtained:  I requested consultation with the cardiologist Dr. Toney Rakes,  and discussed lab and imaging findings as well as pertinent plan - they recommend: The patient should be admitted to the hospital locally and seen by cardiology in the morning, kept on a cardiac monitor tonight At change of shift -c are signed out to Dr. Stark Jock to f/u results and disposition accordingly   Problem List / ED Course / Critical interventions / Medication management  Arryhthmia high on differential - needs w/u - obs in hospital and cardiac monitoring,  Reevaluation of the patient after these medicines showed that the patient stayed the same I have reviewed the patients home medicines and have made adjustments as needed   Social Determinants of Health:  None   Test / Admission - Considered:  Will admit to hospitalist         Final Clinical Impression(s) / ED Diagnoses Final diagnoses:  Syncope, unspecified syncope type    Rx / DC Orders ED Discharge Orders     None         Noemi Chapel, MD 02/06/22 2329

## 2022-02-06 NOTE — ED Triage Notes (Signed)
Pt in form home via [facility] for syncope. Pt states he was sitting in recliner at home, suddenly felt dizzy and hot, had an episode of emesis, then passed out in his chair. Denies any pain. Sinus brady en route per EMS VS en route: 130/74 55HR 18 98%

## 2022-02-07 ENCOUNTER — Encounter (HOSPITAL_COMMUNITY): Payer: Self-pay | Admitting: Cardiology

## 2022-02-07 ENCOUNTER — Telehealth: Payer: Self-pay

## 2022-02-07 DIAGNOSIS — D649 Anemia, unspecified: Secondary | ICD-10-CM | POA: Diagnosis present

## 2022-02-07 DIAGNOSIS — I442 Atrioventricular block, complete: Secondary | ICD-10-CM | POA: Diagnosis not present

## 2022-02-07 DIAGNOSIS — R946 Abnormal results of thyroid function studies: Secondary | ICD-10-CM | POA: Diagnosis present

## 2022-02-07 DIAGNOSIS — D72829 Elevated white blood cell count, unspecified: Secondary | ICD-10-CM | POA: Diagnosis present

## 2022-02-07 DIAGNOSIS — E785 Hyperlipidemia, unspecified: Secondary | ICD-10-CM | POA: Diagnosis not present

## 2022-02-07 DIAGNOSIS — E860 Dehydration: Secondary | ICD-10-CM | POA: Diagnosis present

## 2022-02-07 DIAGNOSIS — I459 Conduction disorder, unspecified: Secondary | ICD-10-CM | POA: Diagnosis present

## 2022-02-07 DIAGNOSIS — Z4509 Encounter for adjustment and management of other cardiac device: Secondary | ICD-10-CM | POA: Diagnosis not present

## 2022-02-07 DIAGNOSIS — I2510 Atherosclerotic heart disease of native coronary artery without angina pectoris: Secondary | ICD-10-CM | POA: Diagnosis present

## 2022-02-07 DIAGNOSIS — I471 Supraventricular tachycardia, unspecified: Secondary | ICD-10-CM | POA: Diagnosis present

## 2022-02-07 DIAGNOSIS — Z96642 Presence of left artificial hip joint: Secondary | ICD-10-CM | POA: Diagnosis present

## 2022-02-07 DIAGNOSIS — R001 Bradycardia, unspecified: Secondary | ICD-10-CM | POA: Diagnosis present

## 2022-02-07 DIAGNOSIS — I455 Other specified heart block: Secondary | ICD-10-CM | POA: Diagnosis present

## 2022-02-07 DIAGNOSIS — E782 Mixed hyperlipidemia: Secondary | ICD-10-CM

## 2022-02-07 DIAGNOSIS — R55 Syncope and collapse: Secondary | ICD-10-CM

## 2022-02-07 DIAGNOSIS — R7989 Other specified abnormal findings of blood chemistry: Secondary | ICD-10-CM

## 2022-02-07 DIAGNOSIS — Z79899 Other long term (current) drug therapy: Secondary | ICD-10-CM | POA: Diagnosis not present

## 2022-02-07 DIAGNOSIS — Z87891 Personal history of nicotine dependence: Secondary | ICD-10-CM | POA: Diagnosis not present

## 2022-02-07 DIAGNOSIS — Z981 Arthrodesis status: Secondary | ICD-10-CM | POA: Diagnosis not present

## 2022-02-07 LAB — COMPREHENSIVE METABOLIC PANEL
ALT: 20 U/L (ref 0–44)
ALT: 18 U/L (ref 0–44)
AST: 21 U/L (ref 15–41)
AST: 24 U/L (ref 15–41)
Albumin: 3.8 g/dL (ref 3.5–5.0)
Albumin: 3.4 g/dL — ABNORMAL LOW (ref 3.5–5.0)
Alkaline Phosphatase: 69 U/L (ref 38–126)
Alkaline Phosphatase: 59 U/L (ref 38–126)
Anion gap: 8 (ref 5–15)
Anion gap: 5 (ref 5–15)
BUN: 17 mg/dL (ref 8–23)
BUN: 16 mg/dL (ref 8–23)
CO2: 30 mmol/L (ref 22–32)
CO2: 27 mmol/L (ref 22–32)
Calcium: 9.3 mg/dL (ref 8.9–10.3)
Calcium: 8.8 mg/dL — ABNORMAL LOW (ref 8.9–10.3)
Chloride: 101 mmol/L (ref 98–111)
Chloride: 105 mmol/L (ref 98–111)
Creatinine, Ser: 1.15 mg/dL (ref 0.61–1.24)
Creatinine, Ser: 1.13 mg/dL (ref 0.61–1.24)
GFR, Estimated: >60 mL/min (ref >60)
GFR, Estimated: >60 mL/min (ref >60)
Glucose, Bld: 118 mg/dL — ABNORMAL HIGH (ref 70–99)
Glucose, Bld: 142 mg/dL — ABNORMAL HIGH (ref 70–99)
Potassium: 4 mmol/L (ref 3.5–5.1)
Potassium: 3.9 mmol/L (ref 3.5–5.1)
Sodium: 139 mmol/L (ref 135–145)
Sodium: 137 mmol/L (ref 135–145)
Total Bilirubin: 0.4 mg/dL (ref 0.3–1.2)
Total Bilirubin: 0.5 mg/dL (ref 0.3–1.2)
Total Protein: 7.4 g/dL (ref 6.5–8.1)
Total Protein: 6.5 g/dL (ref 6.5–8.1)

## 2022-02-07 LAB — LIPID PANEL
Cholesterol: 125 mg/dL (ref 0–200)
HDL: 40 mg/dL — ABNORMAL LOW (ref >40)
LDL Cholesterol: 80 mg/dL (ref 0–99)
Total CHOL/HDL Ratio: 3.1 RATIO
Triglycerides: 23 mg/dL (ref <150)
VLDL: 5 mg/dL (ref 0–40)

## 2022-02-07 LAB — CBC WITH DIFFERENTIAL/PLATELET
Abs Immature Granulocytes: 0.03 10*3/uL (ref 0.00–0.07)
Basophils Absolute: 0 10*3/uL (ref 0.0–0.1)
Basophils Relative: 0 %
Eosinophils Absolute: 0.1 10*3/uL (ref 0.0–0.5)
Eosinophils Relative: 1 %
HCT: 33.4 % — ABNORMAL LOW (ref 39.0–52.0)
Hemoglobin: 11.1 g/dL — ABNORMAL LOW (ref 13.0–17.0)
Immature Granulocytes: 0 %
Lymphocytes Relative: 19 %
Lymphs Abs: 1.6 10*3/uL (ref 0.7–4.0)
MCH: 31.4 pg (ref 26.0–34.0)
MCHC: 33.2 g/dL (ref 30.0–36.0)
MCV: 94.6 fL (ref 80.0–100.0)
Monocytes Absolute: 0.5 10*3/uL (ref 0.1–1.0)
Monocytes Relative: 6 %
Neutro Abs: 6.1 10*3/uL (ref 1.7–7.7)
Neutrophils Relative %: 74 %
Platelets: 171 10*3/uL (ref 150–400)
RBC: 3.53 MIL/uL — ABNORMAL LOW (ref 4.22–5.81)
RDW: 13.2 % (ref 11.5–15.5)
WBC: 8.3 10*3/uL (ref 4.0–10.5)
nRBC: 0 % (ref 0.0–0.2)

## 2022-02-07 LAB — T4, FREE: Free T4: 0.99 ng/dL (ref 0.61–1.12)

## 2022-02-07 LAB — MAGNESIUM: Magnesium: 2.1 mg/dL (ref 1.7–2.4)

## 2022-02-07 LAB — TSH: TSH: 4.508 u[IU]/mL — ABNORMAL HIGH (ref 0.350–4.500)

## 2022-02-07 LAB — TROPONIN I (HIGH SENSITIVITY)
Troponin I (High Sensitivity): 80 ng/L — ABNORMAL HIGH (ref <18)
Troponin I (High Sensitivity): 134 ng/L (ref <18)

## 2022-02-07 MED ORDER — SODIUM CHLORIDE 0.9 % IV SOLN: INTRAVENOUS | Status: DC

## 2022-02-07 MED ORDER — ACETAMINOPHEN 650 MG RE SUPP: 650.0000 mg | Freq: Four times a day (QID) | RECTAL | Status: DC | PRN

## 2022-02-07 MED ORDER — HEPARIN SODIUM (PORCINE) 5000 UNIT/ML IJ SOLN
5000.0000 [IU] | Freq: Three times a day (TID) | INTRAMUSCULAR | Status: DC
  Administered 2022-02-07 – 2022-02-09 (×6): 5000 [IU] via SUBCUTANEOUS
  Filled 2022-02-07 (×6): qty 1

## 2022-02-07 MED ORDER — OXYCODONE HCL 5 MG PO TABS: 5.0000 mg | ORAL_TABLET | ORAL | Status: DC | PRN

## 2022-02-07 MED ORDER — ONDANSETRON HCL 4 MG PO TABS: 4.0000 mg | ORAL_TABLET | Freq: Four times a day (QID) | ORAL | Status: DC | PRN

## 2022-02-07 MED ORDER — ACETAMINOPHEN 325 MG PO TABS: 650.0000 mg | ORAL_TABLET | Freq: Four times a day (QID) | ORAL | Status: DC | PRN

## 2022-02-07 MED ORDER — ONDANSETRON HCL 4 MG/2ML IJ SOLN: 4.0000 mg | Freq: Four times a day (QID) | INTRAMUSCULAR | Status: DC | PRN

## 2022-02-07 MED ORDER — SODIUM CHLORIDE 0.9 % IV BOLUS
500.0000 mL | Freq: Once | INTRAVENOUS | Status: AC
  Administered 2022-02-07: 500 mL via INTRAVENOUS

## 2022-02-07 MED ORDER — ROSUVASTATIN CALCIUM 5 MG PO TABS
5.0000 mg | ORAL_TABLET | Freq: Every day | ORAL | Status: DC
  Administered 2022-02-07 – 2022-02-09 (×3): 5 mg via ORAL
  Filled 2022-02-07 (×3): qty 1

## 2022-02-07 MED ORDER — MORPHINE SULFATE (PF) 2 MG/ML IV SOLN: 2.0000 mg | INTRAVENOUS | Status: DC | PRN

## 2022-02-07 NOTE — H&P (Signed)
History and Physical  ALECXIS BALTZELL OZH:[id number] DOB: Jun 25, 1945 DOA: 02/06/2022  Referring physician: Dr. Veryl Speak PCP: [name], MD   Chief Complaint: Syncope  HPI: Theodore Hutchinson is a 76 y.o. male with a history of CAD, HLD who presented to the ED on 02/06/22 with syncope. His wife and sister-in-law are at bedside to help with history. These episodes have been happening for about two years now. They always seem to happen after a meal, and after he has been sitting down watching television. There have been a few syncopal episodes that resulted in falls. He can feel when they are about to happen, he feels a hot flash when he is normally cold. He then has a syncopal event, in which he cannot be aroused or communicate. He remembers most of what happens before and after each syncopal event. He also has vomiting during these events. There was more vomiting than normal during today's events. He is hard to arouse directly after these events, and the total time from event onset to return to baseline is 59mn. He denies any current complaints.   ED Course: ECG showed sinus bradycardia. CBC showed WBC 12.2 and hgb 12.5. CXR showed emphysema with no active disease. Troponin was 80. CMP showed glucose 118.  Review of Systems: All systems reviewed and apart from history of presenting illness, are negative.  Past Medical History:  Diagnosis Date   Mild CAD    Cardiac catheterization August 2023   Mixed hyperlipidemia    Osteoarthritis    Past Surgical History:  Procedure Laterality Date   CERVICAL DISC SURGERY     x2   CERVICAL FUSION     Dr. CPatrice Paradise1'06 [facility]   HERNIA REPAIR Left    open LIH/mesh 7''16   LEFT HEART CATH AND CORONARY ANGIOGRAPHY N/A 10/05/2021   Procedure: LEFT HEART CATH AND CORONARY ANGIOGRAPHY;  Surgeon: MBurnell Blanks MD;  [facility];  Service: Cardiovascular;  Laterality: N/A;   SHOULDER ARTHROSCOPY W/ ROTATOR CUFF REPAIR Right     stenosis intestines Right    age 49 "pyloric stenosis"   TOTAL HIP ARTHROPLASTY Left 03/14/2016   Procedure: LEFT TOTAL HIP ARTHROPLASTY ANTERIOR APPROACH;  Surgeon: BRod Can MD;  Location: [facility];  Service: Orthopedics;  Laterality: Left;   Social History:  reports that he quit smoking about 14 years ago. His smoking use included cigarettes. He has a 30.00 pack-year smoking history. He has never used smokeless tobacco. He reports current alcohol use of about 1.0 standard drink of alcohol per week. He reports that he does not use drugs.  No Known Allergies  Family History  Problem Relation Age of Onset   Alzheimer's disease Father     Prior to Admission medications   Medication Sig Start Date End Date Taking? Authorizing Provider  Calcium Carbonate Antacid (TUMS ULTRA 1000 PO) Take 2,000 mg by mouth at bedtime.    [provider]  ELDERBERRY PO Take 3,200 mg by mouth as needed.    [provider]  Omega-3 Fatty Acids (FISH OIL PO) Take 3 tablets by mouth at bedtime.    [provider]  rosuvastatin (CRESTOR) 5 MG tablet Take 1 tablet (5 mg total) by mouth daily. 12/28/21 06/26/22  MSatira Sark MD   Physical Exam: Vitals:   02/06/22 2125 02/06/22 2127 02/06/22 2200 02/06/22 2300  BP:   119/70 139/77  Pulse: (!) 55  (!) 54 (!) 54  Resp:  18 15  Temp:   97.7 F (36.5 C)   TempSrc:   Oral   SpO2: 97%  97% 97%  Weight:  77 kg      General exam: Moderately built and nourished patient, lying comfortably supine on the gurney in no obvious distress. Head, eyes and ENT: Nontraumatic and normocephalic. Pupils equally reacting to light and accommodation. Oral mucosa dry. Neck: Supple. No JVD, carotid bruit or thyromegaly. Lymphatics: No lymphadenopathy. Respiratory system: Clear to auscultation. No increased work of breathing. Cardiovascular system: S1 and S2 heard, RRR. No JVD, murmurs, gallops, clicks Gastrointestinal system: Abdomen is  nondistended, soft and nontender. No organomegaly or masses appreciated. Central nervous system: Alert and oriented. No focal neurological deficits. Extremities: No gross deformity or weakness Skin: No rashes or acute findings. Musculoskeletal system: Negative exam. Psychiatry: Pleasant and cooperative.  Labs on Admission:  Basic Metabolic Panel: Recent Labs  Lab 02/06/22 2250  NA 139  K 4.0  CL 101  CO2 30  GLUCOSE 118*  BUN 17  CREATININE 1.15  CALCIUM 9.3   Liver Function Tests: Recent Labs  Lab 02/06/22 2250  AST 21  ALT 20  ALKPHOS 69  BILITOT 0.4  PROT 7.4  ALBUMIN 3.8   No results for input(s): "LIPASE", "AMYLASE" in the last 168 hours. No results for input(s): "AMMONIA" in the last 168 hours. CBC: Recent Labs  Lab 02/06/22 2250  WBC 12.2*  NEUTROABS 9.9*  HGB 12.5*  HCT 37.2*  MCV 94.7  PLT 189   Cardiac Enzymes: No results for input(s): "CKTOTAL", "CKMB", "CKMBINDEX", "TROPONINI" in the last 168 hours. Troponin 1: 80 02/06/22  BNP (last 3 results) No results for input(s): "PROBNP" in the last 8760 hours. CBG: No results for input(s): "GLUCAP" in the last 168 hours.  Radiological Exams on Admission: DG Chest Port 1 View  Result Date: 02/06/2022 CLINICAL DATA:  Syncope EXAM: PORTABLE CHEST 1 VIEW COMPARISON:  03/14/2004, CT 10/04/2021 FINDINGS: Recording device over left chest. No acute airspace disease or pleural effusion. Emphysema. Normal cardiac size. Aortic atherosclerosis IMPRESSION: No active disease. Emphysema. Electronically Signed   By: Donavan Foil M.D.   On: 02/06/2022 23:59     Assessment/Plan Recurrent syncopal episodes with emesis likely cardiac etiology -Loop monitor placed, to be investigated -ECG showed sinus bradycardia -BP on exam was 91/52 -Cardiology consult, appreciate recs -Troponin 80, pending second troponin -Cannot currently rule out seizure like activity with potential postictal state -Zofran for nausea -Likely  dehydrated from vomiting, IVF  Leukocytosis -WBC 12.2 -indeterminate cause, no clear sign of infection, may be due to cardiac etiology -Continue to monitor with CBC  Elevated Troponin -Troponin 80, pending second troponin -Cardiology consult, appreciate recs -Loop monitor placed, to be investigated  Normocytic anemia, possibly chronic -hgb 12.5, last hgb on 10/05/21 was 11.9 -Continue to monitor with CBC  HLD -Continue home Crestor  DVT Prophylaxis: Heparin Code Status: Full  Family Communication: Wife and Sister-in-law at bedside  Disposition Plan: Home pending resolution of symptoms   Time spent: 6mn  Darleene Cumpian D Crissie Aloi, OMS IV [facility] How to contact the [facility]omeAttending or Consulting provider [place] covering provider during after hours [place] for this patient?  Check the care team in [facility] look for a) attending/consulting TRH provider listed and b) the [facility] Camp[facility]eam listed Log into [email or web address] and use [place]'s universal password to access. If you do not have the password, please contact the hospital operator. Locate the TCross [facility] Hospitalprovider you  are looking for under [facility] and page to a number that you can be directly reached. If you still have difficulty reaching the provider, please page the [facility] (Director on Call) for the Hospitalists listed on amion for assistance.

## 2022-02-07 NOTE — Assessment & Plan Note (Addendum)
-  With ongoing workup in the outpatient setting -Currently with loop recorder -Today body posturing concerned the family for possible seizure -Cards to see in the AM -last echo 10/2021 -> No need to update at this short interval.

## 2022-02-07 NOTE — Assessment & Plan Note (Signed)
Continue crestor 

## 2022-02-07 NOTE — Discharge Instructions (Addendum)
     Supplemental Discharge Instructions for  Pacemaker/Defibrillator Patients  Activity No heavy lifting or vigorous activity with your left/right arm for 6 to 8 weeks.  Do not raise your left/right arm above your head for one week.  Gradually raise your affected arm as drawn below.             02/13/22                    02/14/22                   02/15/22                   02/16/22 __  NO DRIVING until cleared by the physician .  WOUND CARE Keep the wound area clean and dry.  Do not get this area wet , no showers until cleared to at your wound check visit . The tape/steri-strips on your wound will fall off; do not pull them off.  No bandage is needed on the site.  DO  NOT apply any creams, oils, or ointments to the wound area. If you notice any drainage or discharge from the wound, any swelling or bruising at the site, or you develop a fever > 101? F after you are discharged home, call the office at once.  Special Instructions You are still able to use cellular telephones; use the ear opposite the side where you have your pacemaker/defibrillator.  Avoid carrying your cellular phone near your device. When traveling through airports, show security personnel your identification card to avoid being screened in the metal detectors.  Ask the security personnel to use the hand wand. Avoid arc welding equipment, MRI testing (magnetic resonance imaging), TENS units (transcutaneous nerve stimulators).  Call the office for questions about other devices. Avoid electrical appliances that are in poor condition or are not properly grounded. Microwave ovens are safe to be near or to operate.

## 2022-02-07 NOTE — ED Notes (Signed)
Pt given lunch tray.

## 2022-02-07 NOTE — Progress Notes (Signed)
Pt arrived to [facility] room 11 via EMS from [facility]. Pt A&O x4, IV x1, on room air. Pt walked independently to bathroom. Belongings at bedside. Weight and vitals obtained. Provider [place] notified of pt's arrival. Call bell given to pt.

## 2022-02-07 NOTE — ED Notes (Signed)
Per heartcare, attempt to interrogate patient's loop recorder. This nurse called [employer] representative who states loop recorders do not work on [employer] at home machines.

## 2022-02-07 NOTE — Progress Notes (Signed)
Patient arrived from [facility]. Seen and examined. He is comfortable and chest pain free. Remains in NSR on the monitor with no pauses. EP to see tomorrow.  Gwyndolyn Kaufman, MD

## 2022-02-07 NOTE — Discharge Summary (Deleted)
Physician Discharge Summary  Theodore Hutchinson JJO:[id number] DOB: 20-Feb-1946 DOA: 02/06/2022  PCP: [name], MD  Admit date: 02/06/2022 Discharge date: 02/07/2022  Admitted From:  Home  Disposition:  Home   Recommendations for Outpatient Follow-up:  Follow up with PCP in 1 weeks Follow up with neurology in 3-4 weeks Follow up with cardiology as scheduled   Discharge Condition: STABLE   CODE STATUS: FULL DIET: resume prior home diet     Brief Hospitalization Summary: Please see all hospital notes, images, labs for full details of the hospitalization. ADMISSION HPI:  76 y.o. male with a history of CAD, HLD who presented to the ED on 02/06/22 with syncope. His wife and sister-in-law are at bedside to help with history. These episodes have been happening for about two years now. They always seem to happen after a meal, and after he has been sitting down watching television. There have been a few syncopal episodes that resulted in falls. He can feel when they are about to happen, he feels a hot flash when he is normally cold. He then has a syncopal event, in which he cannot be aroused or communicate. He remembers most of what happens before and after each syncopal event. He also has vomiting during these events. There was more vomiting than normal during today's events. He is hard to arouse directly after these events, and the total time from event onset to return to baseline is 85mn. He denies any current complaints.    Hospital Course  Patient was evaluated by inpatient cardiology service and they were able to interrogate his loop recorder for this event there was no marked bradycardia or pauses no heart block.  There was no evidence of structural heart disease.  Cardiology recommended patient have an outpatient neurology evaluation for causes of recurrent syncope.  They recommended that he can discharge today.  Patient is feeling better he has had no recurrence of symptoms and feels well.   Discharging home in stable condition.  Ambulatory referral made to neurology.   Discharge Diagnoses:  Principal Problem:   Elevated troponin Active Problems:   Syncope and collapse   Leukocytosis   HLD (hyperlipidemia)   Discharge Instructions: Discharge Instructions     Ambulatory referral to Neurology   Complete by: As directed    An appointment is requested in approximately: 2 weeks      Allergies as of 02/07/2022   No Known Allergies      Medication List     TAKE these medications    ELDERBERRY PO Take 3,200 mg by mouth as needed.   FISH OIL PO Take 3 tablets by mouth at bedtime.   rosuvastatin 5 MG tablet Commonly known as: CRESTOR Take 1 tablet (5 mg total) by mouth daily.   TUMS ULTRA 1000 PO Take 2,000 mg by mouth at bedtime.        Follow-up Information     HNeale Burly MD. Schedule an appointment as soon as possible for a visit in 1 week(s).   Specialty: Internal Medicine Why: Hospital Follow Up Contact information: [phone]         [facility]. Schedule an appointment as soon as possible for a visit in 3 week(s).   Why: Hospital Follow Up Contact information: [address]    [place] [phone]              No Known  Allergies Allergies as of 02/07/2022   No Known Allergies      Medication List     TAKE these medications    ELDERBERRY PO Take 3,200 mg by mouth as needed.   FISH OIL PO Take 3 tablets by mouth at bedtime.   rosuvastatin 5 MG tablet Commonly known as: CRESTOR Take 1 tablet (5 mg total) by mouth daily.   TUMS ULTRA 1000 PO Take 2,000 mg by mouth at bedtime.        Procedures/Studies: DG Chest Port 1 View  Result Date: 02/06/2022 CLINICAL DATA:  Syncope EXAM: PORTABLE CHEST 1 VIEW COMPARISON:  03/14/2004, CT 10/04/2021 FINDINGS: Recording device over left chest. No acute airspace  disease or pleural effusion. Emphysema. Normal cardiac size. Aortic atherosclerosis IMPRESSION: No active disease. Emphysema. Electronically Signed   By: Donavan Foil M.D.   On: 02/06/2022 23:59     Subjective: Pt says he feels well now, no recurrence of symptoms.    Discharge Exam: Vitals:   02/07/22 0800 02/07/22 0900  BP: 113/72 123/67  Pulse: 66 62  Resp: 20 14  Temp:  98 F (36.7 C)  SpO2: 95% (!) 84%   Vitals:   02/07/22 0500 02/07/22 0700 02/07/22 0800 02/07/22 0900  BP: 110/68 109/63 113/72 123/67  Pulse: 62 (!) 58 66 62  Resp: '14 16 20 14  '$ Temp:    98 F (36.7 C)  TempSrc:    Oral  SpO2: 94% 96% 95% (!) 84%  Weight:       General: Pt is alert, awake, not in acute distress Cardiovascular: RRR, S1/S2 +, no rubs, no gallops Respiratory: CTA bilaterally, no wheezing, no rhonchi Abdominal: Soft, NT, ND, bowel sounds + Extremities: no edema, no cyanosis Neurological: nonfocal exam.     The results of significant diagnostics from this hospitalization (including imaging, microbiology, ancillary and laboratory) are listed below for reference.    Microbiology: No results found for this or any previous visit (from the past 240 hour(s)).   Labs: BNP (last 3 results) No results for input(s): "BNP" in the last 8760 hours. Basic Metabolic Panel: Recent Labs  Lab 02/06/22 2250 02/07/22 0455  NA 139 137  K 4.0 3.9  CL 101 105  CO2 30 27  GLUCOSE 118* 142*  BUN 17 16  CREATININE 1.15 1.13  CALCIUM 9.3 8.8*  MG  --  2.1   Liver Function Tests: Recent Labs  Lab 02/06/22 2250 02/07/22 0455  AST 21 24  ALT 20 18  ALKPHOS 69 59  BILITOT 0.4 0.5  PROT 7.4 6.5  ALBUMIN 3.8 3.4*   No results for input(s): "LIPASE", "AMYLASE" in the last 168 hours. No results for input(s): "AMMONIA" in the last 168 hours. CBC: Recent Labs  Lab 02/06/22 2250 02/07/22 0455  WBC 12.2* 8.3  NEUTROABS 9.9* 6.1  HGB 12.5* 11.1*  HCT 37.2* 33.4*  MCV 94.7 94.6  PLT 189 171    Cardiac Enzymes: No results for input(s): "CKTOTAL", "CKMB", "CKMBINDEX", "TROPONINI" in the last 168 hours. BNP: Invalid input(s): "POCBNP" CBG: No results for input(s): "GLUCAP" in the last 168 hours. D-Dimer No results for input(s): "DDIMER" in the last 72 hours. Hgb A1c No results for input(s): "HGBA1C" in the last 72 hours. Lipid Profile Recent Labs    02/07/22 0921  CHOL 125  HDL 40*  LDLCALC 80  TRIG 23  CHOLHDL 3.1   Thyroid function studies Recent Labs    02/06/22 2250  TSH 4.508*   Anemia  work up No results for input(s): "VITAMINB12", "FOLATE", "FERRITIN", "TIBC", "IRON", "RETICCTPCT" in the last 72 hours. Urinalysis No results found for: "COLORURINE", "APPEARANCEUR", "LABSPEC", "PHURINE", "GLUCOSEU", "HGBUR", "BILIRUBINUR", "KETONESUR", "PROTEINUR", "UROBILINOGEN", "NITRITE", "LEUKOCYTESUR" Sepsis Labs Recent Labs  Lab 02/06/22 2250 02/07/22 0455  WBC 12.2* 8.3   Microbiology No results found for this or any previous visit (from the past 240 hour(s)).  Time coordinating discharge:   SIGNED:  Irwin Brakeman, MD  [facility] 02/07/2022, 11:57 AM How to contact the [facility] Attending or Consulting provider [place] or covering provider during after hours [place], for this patient?  Check the care team in [facility] and look for a) attending/consulting TRH provider listed and b) the [facility] team listed Log into [email or web address] and use [place]'s universal password to access. If you do not have the password, please contact the hospital operator. Locate the [facility] provider you are looking for under [facility] and page to a number that you can be directly reached. If you still have difficulty reaching the provider, please page the [facility] (Director on Call) for the Hospitalists listed on amion for assistance.

## 2022-02-07 NOTE — ED Provider Notes (Signed)
  Physical Exam  BP 139/77   Pulse (!) 54   Temp 97.7 F (36.5 C) (Oral)   Resp 15   Wt 77 kg   SpO2 97%   BMI 24.36 kg/m   Physical Exam Constitutional:      General: He is not in acute distress.    Appearance: He is well-developed. He is not diaphoretic.     Procedures  Procedures  ED Course / MDM  Care assumed from Dr. Sabra Heck.  Patient awaiting results of laboratory studies to evaluate for syncope.  Patient with history of syncopal episodes and has an implantable loop recorder.  Dr. Sabra Heck is spoken with Dr. Jerilee Hoh from cardiology who is recommending admission.  I have spoken with the hospitalist here at [facility] who agrees to admit.       Veryl Speak, MD 02/07/22 [phone]

## 2022-02-07 NOTE — ED Notes (Signed)
Wife and son updated on plan of care to [facility] tomorrow

## 2022-02-07 NOTE — ED Notes (Signed)
[facility] notified bed ready status at this time.

## 2022-02-07 NOTE — Telephone Encounter (Signed)
Following alert received from [employer] received for ILR summary report received. Symptom episode detected was bradycardia 02/06/2022 @ 8:16 PM.

## 2022-02-07 NOTE — Consult Note (Addendum)
**addendum1230pm: notified after initial consult that not all the alerts were sent to [place] from the loop recorder during the syncopal episode. Additional data shows sinus brady with prolonging pauses with eventual sinus arrest with initially junctional escape beats then ventricular escape beats with 5.6 second pauses between at 810 pm.  - plan to transfer to [facility] for EP evaluation and likely pacemaker.    Cardiology Consultation   Patient ID: Theodore Hutchinson MRN: [id number]; DOB: 1946-01-23  Admit date: 02/06/2022 Date of Consult: 02/07/2022  PCP:  [name], MD   [facility] Cardiologist:  Rozann Lesches, MD  Electrophysiologist:  Melida Quitter, MD       Patient Profile:   Theodore Hutchinson is a 76 y.o. 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 Clearence Ped.  History of Present Illness:   Theodore Hutchinson was recently admitted to [facility] in 10/2021 for evaluation of a syncopal episode which occurred at home while watching television. Initial EKG had shown ST elevation in the inferior leads but subsequent tracings showed no significant ST changes. Hs troponin values did trend up to 143 and a cardiac catheterization was performed which showed mild nonobstructive CAD with only 20 to 30% scattered stenosis and medical management was recommended. Echocardiogram showed a preserved EF of 60 to 65% with no regional wall motion abnormalities. He did have grade 1 diastolic dysfunction, normal RV function and no significant valve abnormalities. An outpatient monitor was placed which showed a 3-second pause with probable junctional escape beat and 1 run of SVT lasting for only 4 beats with rare PAC's and PVC's overall less than 1% burden. Given his recurrent syncopal episodes (prior episodes occurring in a restaurant and grocery store), he was  referred to EP for consideration of ILR. This was placed by Dr. Myles Gip on 01/17/2022.  He presented to [facility] ED last night for a recurrent syncopal episode which occurred while sitting in his recliner. In talking with the patient today, he reports he is usually very active at baseline and does not experience chest pain or dyspnea on exertion with routine activities. Yesterday, he was in his normal state of health and had supper around 1830 and was sitting in the recliner afterwards. At approximately 2030, he became diaphoretic along his scalp and reports multiple episodes of vomiting. He initially says he did not lose consciousness but later says he does not recall his wife calling EMS or the events surrounding this, therefore he thinks he did lose consciousness for a few minutes. His wife told him he was slurring his words at that time but no facial droop he is aware of. No loss of bowel or bladder continence. He has since felt back in his normal state of health overnight and this morning.  Initial labs showed WBC 12.2, Hgb 12.5, platelets 189, Na+ 139, K+ 4.0 and creatinine 1.15. TSH 4.508. Magnesium normal at 2.1. Initial and repeat troponin values have been elevated at 80, 134 and 106. CXR with no active disease. EKG shows sinus bradycardia, heart rate 55 with no acute ST changes.  Past Medical History:  Diagnosis Date   Mild CAD    Cardiac catheterization August 2023   Mixed hyperlipidemia    Osteoarthritis     Past Surgical History:  Procedure Laterality Date   CERVICAL DISC SURGERY  x2   CERVICAL FUSION     Dr. Patrice Paradise 1'06 [facility]   HERNIA REPAIR Left    open LIH/mesh 7''16   LEFT HEART CATH AND CORONARY ANGIOGRAPHY N/A 10/05/2021   Procedure: LEFT HEART CATH AND CORONARY ANGIOGRAPHY;  Surgeon: Burnell Blanks, MD;  Location: [facility];  Service: Cardiovascular;  Laterality: N/A;   SHOULDER ARTHROSCOPY W/ ROTATOR CUFF REPAIR Right    stenosis intestines Right     age 1 "pyloric stenosis"   TOTAL HIP ARTHROPLASTY Left 03/14/2016   Procedure: LEFT TOTAL HIP ARTHROPLASTY ANTERIOR APPROACH;  Surgeon: Rod Can, MD;  Location: [facility];  Service: Orthopedics;  Laterality: Left;     Home Medications:  Prior to Admission medications   Medication Sig Start Date End Date Taking? Authorizing Provider  Calcium Carbonate Antacid (TUMS ULTRA 1000 PO) Take 2,000 mg by mouth at bedtime.   Yes [provider]  ELDERBERRY PO Take 3,200 mg by mouth as needed.   Yes [provider]  Omega-3 Fatty Acids (FISH OIL PO) Take 3 tablets by mouth at bedtime.   Yes [provider]  rosuvastatin (CRESTOR) 5 MG tablet Take 1 tablet (5 mg total) by mouth daily. 12/28/21 06/26/22 Yes Satira Sark, MD    Inpatient Medications: Scheduled Meds:  heparin  5,000 Units Subcutaneous Q8H   rosuvastatin  5 mg Oral Daily   Continuous Infusions:  sodium chloride 75 mL/hr at 02/07/22 0147   PRN Meds: acetaminophen **OR** acetaminophen, morphine injection, ondansetron **OR** ondansetron (ZOFRAN) IV, oxyCODONE  Allergies:   No Known Allergies  Social History:   Social History   Socioeconomic History   Marital status: Married    Spouse name: Not on file   Number of children: Not on file   Years of education: Not on file   Highest education level: Not on file  Occupational History   Not on file  Tobacco Use   Smoking status: Former    Packs/day: 1.00    Years: 30.00    Total pack years: 30.00    Types: Cigarettes    Quit date: 03/08/2007    Years since quitting: 14.9   Smokeless tobacco: Never  Substance and Sexual Activity   Alcohol use: Yes    Alcohol/week: 1.0 standard drink of alcohol    Types: 1 Cans of beer per week    Comment: rare social 2-3 per month   Drug use: No   Sexual activity: Not on file  Other Topics Concern   Not on file  Social History Narrative   Not on file   Social Determinants of Health   Financial  Resource Strain: Not on file  Food Insecurity: Not on file  Transportation Needs: Not on file  Physical Activity: Not on file  Stress: Not on file  Social Connections: Not on file  Intimate Partner Violence: Not on file    Family History:    Family History  Problem Relation Age of Onset   Alzheimer's disease Father      ROS:  Please see the history of present illness.   All other ROS reviewed and negative.     Physical Exam/Data:   Vitals:   02/07/22 0200 02/07/22 0300 02/07/22 0400 02/07/22 0500  BP: 128/70 108/74 113/63 110/68  Pulse: 62 60 63 62  Resp: 18 14 (!) 22 14  Temp:  98.4 F (36.9 C)    TempSrc:  Oral    SpO2: 97% 97% 99% 94%  Weight:  No intake or output data in the 24 hours ending 02/07/22 0807    02/06/2022    9:27 PM 01/17/2022   10:51 AM 12/28/2021    2:05 PM  Last 3 Weights  Weight (lbs) 169 lb 12.8 oz 169 lb 12.8 oz 168 lb 12.8 oz  Weight (kg) 77.021 kg 77.021 kg 76.567 kg     Body mass index is 24.36 kg/m.  General:  Well nourished, well developed male appearing in no acute distress. HEENT: normal Neck: no JVD Vascular: No carotid bruits; Distal pulses 2+ bilaterally Cardiac:  normal S1, S2; RRR; no murmur  Lungs:  clear to auscultation bilaterally, no wheezing, rhonchi or rales  Abd: soft, nontender, no hepatomegaly  Ext: no pitting edema Musculoskeletal:  No deformities, BUE and BLE strength normal and equal Skin: warm and dry  Neuro:  CNs 2-12 intact, no focal abnormalities noted Psych:  Normal affect   EKG:  The EKG was personally reviewed and demonstrates: Sinus bradycardia, heart rate 55 with no acute ST changes.  Telemetry:  Telemetry was personally reviewed and demonstrates: Sinus rhythm with HR in the 50's to 60's.   Relevant CV Studies:  Echocardiogram: 10/2021 IMPRESSIONS     1. Left ventricular ejection fraction, by estimation, is 60 to 65%. The  left ventricle has normal function. The left ventricle has no  regional  wall motion abnormalities. Left ventricular diastolic parameters are  consistent with Grade I diastolic  dysfunction (impaired relaxation).   2. Right ventricular systolic function is normal. The right ventricular  size is mildly enlarged. Tricuspid regurgitation signal is inadequate for  assessing PA pressure.   3. The mitral valve is normal in structure. No evidence of mitral valve  regurgitation. No evidence of mitral stenosis.   4. The aortic valve is abnormal. There is moderate calcification of the  aortic valve. Aortic valve regurgitation is not visualized. Aortic valve  sclerosis/calcification is present, without any evidence of aortic  stenosis.   5. The inferior vena cava is dilated in size with >50% respiratory  variability, suggesting right atrial pressure of 8 mmHg.   LHC: 10/2021   Prox RCA lesion is 20% stenosed.   Mid RCA lesion is 30% stenosed.   Mid LAD lesion is 30% stenosed.   Mid LAD to Dist LAD lesion is 30% stenosed.   Mild non-obstructive CAD Normal LV filling pressure   Recommendations: No further ischemic workup. Medical management of CAD.     Event Monitor: 10/2021 Impressions: 1 documented pause at 8:40 pm of 3 seconds with probable junctional escape beat. No documented symptoms during pause. In setting of syncope, consider EP consultation. -------------------------------------------------------------- Patch Wear Time:  13 days and 16 hours (2023-08-04T14:04:59-0400 to 2023-08-18T06:10:36-0400)   Patient had a min HR of 37 bpm, max HR of 133 bpm, and avg HR of 62 bpm. Predominant underlying rhythm was Sinus Rhythm. 1 run of Supraventricular Tachycardia occurred lasting 4 beats with a max rate of 133 bpm (avg 119 bpm). 1 Pause occurred lasting 3  secs (20 bpm). Isolated SVEs were rare (<1.0%), SVE Couplets were rare (<1.0%), and SVE Triplets were rare (<1.0%). Isolated VEs were rare (<1.0%), and no VE Couplets or VE Triplets were present.      Laboratory Data:  High Sensitivity Troponin:   Recent Labs  Lab 02/06/22 2250 02/07/22 0212 02/07/22 0455  TROPONINIHS 80* 134* 106*     Chemistry Recent Labs  Lab 02/06/22 2250 02/07/22 0455  NA 139 137  K 4.0 3.9  CL 101 105  CO2 30 27  GLUCOSE 118* 142*  BUN 17 16  CREATININE 1.15 1.13  CALCIUM 9.3 8.8*  MG  --  2.1  GFRNONAA >60 >60  ANIONGAP 8 5    Recent Labs  Lab 02/06/22 2250 02/07/22 0455  PROT 7.4 6.5  ALBUMIN 3.8 3.4*  AST 21 24  ALT 20 18  ALKPHOS 69 59  BILITOT 0.4 0.5   Lipids No results for input(s): "CHOL", "TRIG", "HDL", "LABVLDL", "LDLCALC", "CHOLHDL" in the last 168 hours.  Hematology Recent Labs  Lab 02/06/22 2250 02/07/22 0455  WBC 12.2* 8.3  RBC 3.93* 3.53*  HGB 12.5* 11.1*  HCT 37.2* 33.4*  MCV 94.7 94.6  MCH 31.8 31.4  MCHC 33.6 33.2  RDW 13.0 13.2  PLT 189 171   Thyroid  Recent Labs  Lab 02/06/22 2250  TSH 4.508*    BNPNo results for input(s): "BNP", "PROBNP" in the last 168 hours.  DDimer No results for input(s): "DDIMER" in the last 168 hours.   Radiology/Studies:  DG Chest Port 1 View  Result Date: 02/06/2022 CLINICAL DATA:  Syncope EXAM: PORTABLE CHEST 1 VIEW COMPARISON:  03/14/2004, CT 10/04/2021 FINDINGS: Recording device over left chest. No acute airspace disease or pleural effusion. Emphysema. Normal cardiac size. Aortic atherosclerosis IMPRESSION: No active disease. Emphysema. Electronically Signed   By: Donavan Foil M.D.   On: 02/06/2022 23:59     Assessment and Plan:   1. Recurrent Syncopal Episodes - This is at least his 4th syncopal episode and he reports similar prodromal symptoms with diaphoresis along the top of the scalp and associated vomiting and then had a brief syncopal episode. No specific chest pain or palpitations. - Initial lab work shows normal electrolytes and hemoglobin. TSH slightly elevated at 4.508. Will check Free T4. Troponin values are elevated as outlined below. - He does have  an ILR in place and we will work on obtaining the interrogation of this. Attempted with the [facility]lin device in the ED but does not work on [employer].   Addendum: Contacted the patient's wife who reported she sent a remote transmission when the episode occurred last night. Aaron Edelman with [employer] confirmed this was received and he was in NSR with HR in the 60's to 70's. Will see if the report can be scanned into his chart or faxed over for review.   2. Elevated Troponin Values/CAD - Hs Troponin values have been flat at 80, 134 and 106 which are similar to his admission in 10/2021. Catheterization at that time showed mild nonobstructive CAD with only 20 to 30% scattered stenosis and medical management was recommended. No recent anginal symptoms.  - No indication for further ischemic testing at this time. Remains on statin therapy.   3. HLD - LDL was at 124 in 10/2021 and he was started on Crestor '5mg'$  daily as he was previously intolerant to Atorvastatin. Will add-on a repeat FLP to AM labs.   For questions or updates, please contact [place] Please consult [email or web address] for contact info under    Signed, Erma Heritage, PA-C  02/07/2022 8:07 AM  Attending note Patient seen and discussed with PA Ahmed Prima, I agree with her documentation. 76 yo male history of syncope with loop recorder, nonobstructive CAD by cath, admitted with syncope.     ER vitals: p 54 bp 119/70 97% RA Wbc 12.2 Hgb 12.5 Plt 189 K Cr 1.15 BUN 17 TSH 4.5  Trop 80-->134-->106 CXR no acute process EKG SR  55  10/2021 echo: LVEF 60-65%< no WMAs, grade I dd, normal RV 10/2021 cath: mild nonobstructive disease 10/2021 14 day monitor 1 documented pause at 8:40 pm of 3 seconds with probable junctional escape beat. No documented symptoms during pause   Loop recorder check: sinus brady around 50, some junctional escape beats  1.Syncope - long history of syncope followed by general cards and EP as outpatient, has loop  recorder - prior extensive workup following 10/2021 admission with syncope was negative - 10/2021 monitor isolated 3 second pause without reported symptoms - loop recorder check for this event sinus brady to 50s, some junctional escape beats. No marked bradycardia or pauses, no heart block. This was in the setting of vomiting.   Episodes mixed in that sometimes a prodrome of dizziness, diaphoretic, nauseous with occasional vomiting but has had prior episodes without significant prodrome. Some association with meals but not always.  - no evidence of structural heart disease, we have not documented a rhythm consistent with episodes by 14 day monitor nor loop recorder.  - continue outpatient loop recorder, would consider outpatient neuro eval given no clear cardiac cause has been found. Perhaps neurocardiogenic syncope though difficult to confirm given some variation in symptoms. [place] for discharge today  **addendum: notified after initial consult that not all the alerts were sent to [place] from the loop recorder during the episode. Additional data shows sinus brady with prolonging pauses with eventual sinus arrest with initially junctional escape beats then ventricular escape beats with 5.6 second pauses between at 810 pm.  - plan to transfer to [facility] for EP evaluatoin and likely pacemaker.    2. Elevated troponion - similar presentation of syncope and trop elevation in 10/2021, cath at that time showed mild nonobstructive CAD. CT PE at that time was negative - perhaps related to transient hypotension related to whatever is causing his syncope/cerebral hypoperfusion.   Carlyle Dolly MD

## 2022-02-07 NOTE — Assessment & Plan Note (Signed)
-  Likely reactive in the setting of syncope vs seizure like activity -WBC 12.2 -No infectious symptoms noted -Trend WBC count, and continue to monitor

## 2022-02-07 NOTE — Assessment & Plan Note (Addendum)
-  Trop as high as 80 -Likely demand ischemia in the setting of syncope -Repeat trop -Monitor on tele -EKG shows SR, HR 55, Qtc 446 -Cardiology consulted from ED and rec's admission with IP cards consult -Continue to monitor

## 2022-02-07 NOTE — TOC Progression Note (Signed)
  Transition of Care [facility]) Screening Note   Patient Details  Name: Theodore Hutchinson Date of Birth: 11-26-1945   Transition of Care [facility]) CM/SW Contact:    Shade Flood, LCSW Phone Number: 02/07/2022, 10:47 AM    Transition of Care Department [facility]) has reviewed patient and no TOC needs have been identified at this time. We will continue to monitor patient advancement through interdisciplinary progression rounds. If new patient transition needs arise, please place a TOC consult.

## 2022-02-07 NOTE — ED Notes (Signed)
IV removed and patient went over discharge instructions, however cardiology messaged this nurse to state the patient cannot be discharged yet and would follow up with the patient shortly.

## 2022-02-07 NOTE — Progress Notes (Signed)
   Notified by EP and the Device Team that the initial two reports that were received and reviewed were from the patient triggered events and did not capture the preceding rhythm. This has now been uploaded (under telephone encounter with time stamp of [id number]) and shows significant pauses greater than 5 seconds. Discharge canceled. Dr. Harl Bowie will review with the patient and anticipate transfer to [facility] for EP evaluation and PPM placement.  Signed, Erma Heritage, PA-C 02/07/2022, 12:29 PM

## 2022-02-07 NOTE — ED Notes (Signed)
Pt given breakfast tray

## 2022-02-08 ENCOUNTER — Encounter (HOSPITAL_COMMUNITY)
Admission: EM | Disposition: A | Discharge status: Home / Self Care | Payer: Self-pay | Source: Home / Self Care | Attending: Cardiology

## 2022-02-08 ENCOUNTER — Other Ambulatory Visit: Payer: Self-pay

## 2022-02-08 DIAGNOSIS — R001 Bradycardia, unspecified: Secondary | ICD-10-CM

## 2022-02-08 DIAGNOSIS — R55 Syncope and collapse: Secondary | ICD-10-CM | POA: Diagnosis not present

## 2022-02-08 HISTORY — PX: PACEMAKER IMPLANT: EP1218

## 2022-02-08 HISTORY — PX: LOOP RECORDER REMOVAL: EP1215

## 2022-02-08 LAB — SURGICAL PCR SCREEN
MRSA, PCR: NEGATIVE
Staphylococcus aureus: NEGATIVE

## 2022-02-08 LAB — TROPONIN I (HIGH SENSITIVITY): Troponin I (High Sensitivity): 106 ng/L (ref <18)

## 2022-02-08 SURGERY — PACEMAKER IMPLANT

## 2022-02-08 MED ORDER — SODIUM CHLORIDE 0.9 % IV SOLN
80.0000 mg | INTRAVENOUS | Status: AC
  Administered 2022-02-08: 80 mg

## 2022-02-08 MED ORDER — CEFAZOLIN SODIUM-DEXTROSE 2-4 GM/100ML-% IV SOLN
INTRAVENOUS | Status: AC
  Filled 2022-02-08: qty 100

## 2022-02-08 MED ORDER — FENTANYL CITRATE (PF) 100 MCG/2ML IJ SOLN
INTRAMUSCULAR | Status: DC | PRN
  Administered 2022-02-08: 25 ug via INTRAVENOUS

## 2022-02-08 MED ORDER — SODIUM CHLORIDE 0.9% FLUSH
3.0000 mL | Freq: Two times a day (BID) | INTRAVENOUS | Status: DC
  Administered 2022-02-08: 3 mL via INTRAVENOUS

## 2022-02-08 MED ORDER — ONDANSETRON HCL 4 MG/2ML IJ SOLN: 4.0000 mg | Freq: Four times a day (QID) | INTRAMUSCULAR | Status: DC | PRN

## 2022-02-08 MED ORDER — MIDAZOLAM HCL 5 MG/5ML IJ SOLN
INTRAMUSCULAR | Status: AC
  Filled 2022-02-08: qty 5

## 2022-02-08 MED ORDER — HEPARIN (PORCINE) IN NACL 1000-0.9 UT/500ML-% IV SOLN
INTRAVENOUS | Status: DC | PRN
  Administered 2022-02-08: 500 mL

## 2022-02-08 MED ORDER — SODIUM CHLORIDE 0.9% FLUSH: 3.0000 mL | INTRAVENOUS | Status: DC | PRN

## 2022-02-08 MED ORDER — CEFAZOLIN SODIUM-DEXTROSE 2-4 GM/100ML-% IV SOLN
2.0000 g | INTRAVENOUS | Status: AC
  Administered 2022-02-08: 2 g via INTRAVENOUS

## 2022-02-08 MED ORDER — SODIUM CHLORIDE 0.9 % IV SOLN: INTRAVENOUS | Status: DC

## 2022-02-08 MED ORDER — LIDOCAINE HCL (PF) 1 % IJ SOLN
INTRAMUSCULAR | Status: DC | PRN
  Administered 2022-02-08: 60 mL

## 2022-02-08 MED ORDER — ACETAMINOPHEN 325 MG PO TABS: 325.0000 mg | ORAL_TABLET | ORAL | Status: DC | PRN

## 2022-02-08 MED ORDER — CHLORHEXIDINE GLUCONATE 4 % EX LIQD: 60.0000 mL | Freq: Once | CUTANEOUS | Status: DC

## 2022-02-08 MED ORDER — FENTANYL CITRATE (PF) 100 MCG/2ML IJ SOLN
INTRAMUSCULAR | Status: AC
  Filled 2022-02-08: qty 2

## 2022-02-08 MED ORDER — CHLORHEXIDINE GLUCONATE 4 % EX LIQD
60.0000 mL | Freq: Once | CUTANEOUS | Status: AC
  Administered 2022-02-08: 4 via TOPICAL
  Filled 2022-02-08: qty 60

## 2022-02-08 MED ORDER — CEFAZOLIN SODIUM-DEXTROSE 1-4 GM/50ML-% IV SOLN
1.0000 g | Freq: Three times a day (TID) | INTRAVENOUS | Status: DC
  Administered 2022-02-08 – 2022-02-09 (×2): 1 g via INTRAVENOUS
  Filled 2022-02-08 (×3): qty 50

## 2022-02-08 MED ORDER — MIDAZOLAM HCL 5 MG/5ML IJ SOLN
INTRAMUSCULAR | Status: DC | PRN
  Administered 2022-02-08: 1 mg via INTRAVENOUS

## 2022-02-08 MED ORDER — SODIUM CHLORIDE 0.9 % IV SOLN: 250.0000 mL | INTRAVENOUS | Status: DC

## 2022-02-08 MED ORDER — LIDOCAINE HCL (PF) 1 % IJ SOLN
INTRAMUSCULAR | Status: AC
  Filled 2022-02-08: qty 60

## 2022-02-08 MED ORDER — SODIUM CHLORIDE 0.9 % IV SOLN
INTRAVENOUS | Status: AC
  Filled 2022-02-08: qty 2

## 2022-02-08 SURGICAL SUPPLY — 11 items
CABLE SURGICAL S-101-97-12 (CABLE) ×1 IMPLANT
KIT ACCESSORY SELECTRA FIX CVD (MISCELLANEOUS) IMPLANT
LEAD SELECTRA 3D-55-42 (CATHETERS) IMPLANT
LEAD SOLIA S PRO MRI 53 (Lead) IMPLANT
LEAD SOLIA S PRO MRI 60 (Lead) IMPLANT
PACEMAKER EDORA 8DR-T MRI (Pacemaker) IMPLANT
PAD DEFIB RADIO PHYSIO CONN (PAD) ×1 IMPLANT
SHEATH 7FR PRELUDE SNAP 13 (SHEATH) IMPLANT
SHEATH 9FR PRELUDE SNAP 13 (SHEATH) IMPLANT
SHEATH PROBE COVER 6X72 (BAG) IMPLANT
TRAY PACEMAKER INSERTION (PACKS) ×1 IMPLANT

## 2022-02-08 NOTE — Interval H&P Note (Signed)
History and Physical Interval Note:  02/08/2022 1:48 PM  Theodore Hutchinson  has presented today for surgery, with the diagnosis of symptomatic brady cardia.  The various methods of treatment have been discussed with the patient and family. After consideration of risks, benefits and other options for treatment, the patient has consented to  Procedure(s): PACEMAKER IMPLANT (N/A) LOOP RECORDER REMOVAL (N/A) as a surgical intervention.  The patient's history has been reviewed, patient examined, no change in status, stable for surgery.  I have reviewed the patient's chart and labs.  Questions were answered to the patient's satisfaction.     Stavros Cail [employer]

## 2022-02-08 NOTE — Progress Notes (Signed)
Pt arrived from EP lab at 1630. A&O x4. VSS. Left chest dressings intact. Sequenced vitals set up. Family at bedside.

## 2022-02-08 NOTE — Plan of Care (Signed)

## 2022-02-08 NOTE — H&P (View-Only) (Signed)
Cardiology Consultation   Patient ID: Theodore Hutchinson MRN: [id number]; DOB: [birth year]  Admit date: 02/06/2022 Date of Consult: 02/08/2022  PCP:  [name], MD   [place] [facility] Cardiologist:  Rozann Lesches, MD  Electrophysiologist:  Melida Quitter, MD  {   Patient Profile:   Theodore Hutchinson is a 76 y.o. male with a hx of HLD, OA< and syncope who is being seen 02/08/2022 for the evaluation of symptomatic bradycardia at the request of Dr. Harl Bowie.  History of Present Illness:   Mr. Derusha has a hx of unexplained syncope, w/u initially unrevealing including a LHC with no obstructive CAD and an echo with preserved LVE, out patient monitoring with no findings to explain his syncope ultimately leading to placement of an ILR by Dr. Myles Gip last month.  He presented to [facility] yesterday early morning after another syncopal event. Reportedly was in his recliner, became diaphoretic > vomiting > LOC, wife called 911. Waking with perhaps some slurring of his words though no focal neuro deficits otherwise and then back to baseline.  No associate CP, palpitations or cardiac awareness before/during/after. This his 4th syncopal event   Interrogation of his loop did note marked SB leading to pausing of >5 seconds back-to-back w/junctional escape beats. EMS data is not available On arrival to the ER SB 50's, stable BP, and at his baseline with no ongoing symptoms.  Planned to transfer to [facility] for EP evaluation and likely PPM  LABS K+ 4.0 > 3.9 Mag 2.1 BUN/Creat 16/1.13 HS Trop 80 > 134 > 106 WBC 12.2 >> 8.3 H/H 11.1/33 Plts 171 TSH 4.508  Free T4 0.99  He continues to feel well currently   Past Medical History:  Diagnosis Date   Mild CAD    Cardiac catheterization August 2023   Mixed hyperlipidemia    Osteoarthritis     Past Surgical History:  Procedure Laterality Date   CERVICAL DISC SURGERY     x2   CERVICAL FUSION     Dr. Patrice Paradise 1'06 [facility]   HERNIA  REPAIR Left    open LIH/mesh 7''16   LEFT HEART CATH AND CORONARY ANGIOGRAPHY N/A 10/05/2021   Procedure: LEFT HEART CATH AND CORONARY ANGIOGRAPHY;  Surgeon: Burnell Blanks, MD;  Location: [place] [facility];  Service: Cardiovascular;  Laterality: N/A;   SHOULDER ARTHROSCOPY W/ ROTATOR CUFF REPAIR Right    stenosis intestines Right    age 16 "pyloric stenosis"   TOTAL HIP ARTHROPLASTY Left 03/14/2016   Procedure: LEFT TOTAL HIP ARTHROPLASTY ANTERIOR APPROACH;  Surgeon: Rod Can, MD;  Location: [facility];  Service: Orthopedics;  Laterality: Left;     Home Medications:  Prior to Admission medications   Medication Sig Start Date End Date Taking? Authorizing Provider  Calcium Carbonate Antacid (TUMS ULTRA 1000 PO) Take 2,000 mg by mouth at bedtime.   Yes [provider]  ELDERBERRY PO Take 3,200 mg by mouth as needed.   Yes [provider]  Omega-3 Fatty Acids (FISH OIL PO) Take 3 tablets by mouth at bedtime.   Yes [provider]  rosuvastatin (CRESTOR) 5 MG tablet Take 1 tablet (5 mg total) by mouth daily. 12/28/21 06/26/22 Yes Satira Sark, MD    Inpatient Medications: Scheduled Meds:  heparin  5,000 Units Subcutaneous Q8H   rosuvastatin  5 mg Oral Daily   Continuous Infusions:  PRN Meds: acetaminophen **OR** acetaminophen, ondansetron **OR** ondansetron (ZOFRAN) IV, oxyCODONE  Allergies:   No Known Allergies  Social History:   Social History   Socioeconomic History   Marital status: Married    Spouse name: Not on file   Number of children: Not on file   Years of education: Not on file   Highest education level: Not on file  Occupational History   Not on file  Tobacco Use   Smoking status: Former    Packs/day: 1.00    Years: 30.00    Total pack years: 30.00    Types: Cigarettes    Quit date: 03/08/2007    Years since quitting: 14.9   Smokeless tobacco: Never  Substance and Sexual Activity   Alcohol use: Yes    Alcohol/week:  1.0 standard drink of alcohol    Types: 1 Cans of beer per week    Comment: rare social 2-3 per month   Drug use: No   Sexual activity: Not on file  Other Topics Concern   Not on file  Social History Narrative   Not on file   Social Determinants of Health   Financial Resource Strain: Not on file  Food Insecurity: No Food Insecurity (02/07/2022)   Hunger Vital Sign    Worried About Running Out of Food in the Last Year: Never true    Ran Out of Food in the Last Year: Never true  Transportation Needs: No Transportation Needs (02/07/2022)   PRAPARE - Hydrologist (Medical): No    Lack of Transportation (Non-Medical): No  Physical Activity: Not on file  Stress: Not on file  Social Connections: Not on file  Intimate Partner Violence: Not At Risk (02/07/2022)   Humiliation, Afraid, Rape, and Kick questionnaire    Fear of Current or Ex-Partner: No    Emotionally Abused: No    Physically Abused: No    Sexually Abused: No    Family History:   Family History  Problem Relation Age of Onset   Alzheimer's disease Father      ROS:  Please see the history of present illness.  All other ROS reviewed and negative.     Physical Exam/Data:   Vitals:   02/07/22 1829 02/07/22 1949 02/08/22 0002 02/08/22 0454  BP: (!) 125/56 113/64 110/62 120/67  Pulse: 75 60 63 (!) 50  Resp: '18 18 18 18  '$ Temp: 98.3 F (36.8 C) 98 F (36.7 C) 98 F (36.7 C) 97.9 F (36.6 C)  TempSrc: Oral Oral Oral Oral  SpO2: 94% 94% 93% 94%  Weight: 77.2 kg   77 kg  Height: '5\' 10"'$  (1.778 m)       Intake/Output Summary (Last 24 hours) at 02/08/2022 0836 Last data filed at 02/07/2022 2005 Gross per 24 hour  Intake 1027.5 ml  Output --  Net 1027.5 ml      02/08/2022    4:54 AM 02/07/2022    6:29 PM 02/06/2022    9:27 PM  Last 3 Weights  Weight (lbs) 169 lb 12.8 oz 170 lb 4.8 oz 169 lb 12.8 oz  Weight (kg) 77.021 kg 77.248 kg 77.021 kg     Body mass index is 24.36 kg/m.   General:  Well nourished, well developed, in no acute distress HEENT: normal Neck: no JVD Vascular: No carotid bruits; Distal pulses 2+ bilaterally Cardiac:  RRR, soft SM, no gallops or rubs Lungs:  CTA b/l, no wheezing, rhonchi or rales  Abd: soft, nontender Ext: no edema Musculoskeletal:  No deformities Skin: warm and dry  Neuro:  no focal abnormalities noted Psych:  Normal affect   EKG:  The EKG was personally reviewed and demonstrates:    SB 55bpm, no acute/ischemic looking changes  Telemetry:  Telemetry was personally reviewed and demonstrates:   SB/SR 50's-60's, no recurrent pausing  Relevant CV Studies:  Echocardiogram: 10/2021 IMPRESSIONS   1. Left ventricular ejection fraction, by estimation, is 60 to 65%. The  left ventricle has normal function. The left ventricle has no regional  wall motion abnormalities. Left ventricular diastolic parameters are  consistent with Grade I diastolic  dysfunction (impaired relaxation).   2. Right ventricular systolic function is normal. The right ventricular  size is mildly enlarged. Tricuspid regurgitation signal is inadequate for  assessing PA pressure.   3. The mitral valve is normal in structure. No evidence of mitral valve  regurgitation. No evidence of mitral stenosis.   4. The aortic valve is abnormal. There is moderate calcification of the  aortic valve. Aortic valve regurgitation is not visualized. Aortic valve  sclerosis/calcification is present, without any evidence of aortic  stenosis.   5. The inferior vena cava is dilated in size with >50% respiratory  variability, suggesting right atrial pressure of 8 mmHg.    LHC: 10/2021   Prox RCA lesion is 20% stenosed.   Mid RCA lesion is 30% stenosed.   Mid LAD lesion is 30% stenosed.   Mid LAD to Dist LAD lesion is 30% stenosed.   Mild non-obstructive CAD Normal LV filling pressure Recommendations: No further ischemic workup. Medical management of CAD.      Event  Monitor: 10/2021 Impressions: 1 documented pause at 8:40 pm of 3 seconds with probable junctional escape beat. No documented symptoms during pause. In setting of syncope, consider EP consultation.  Laboratory Data:  High Sensitivity Troponin:   Recent Labs  Lab 02/06/22 2250 02/07/22 0212 02/07/22 0455  TROPONINIHS 80* 134* 106*     Chemistry Recent Labs  Lab 02/06/22 2250 02/07/22 0455  NA 139 137  K 4.0 3.9  CL 101 105  CO2 30 27  GLUCOSE 118* 142*  BUN 17 16  CREATININE 1.15 1.13  CALCIUM 9.3 8.8*  MG  --  2.1  GFRNONAA >60 >60  ANIONGAP 8 5    Recent Labs  Lab 02/06/22 2250 02/07/22 0455  PROT 7.4 6.5  ALBUMIN 3.8 3.4*  AST 21 24  ALT 20 18  ALKPHOS 69 59  BILITOT 0.4 0.5   Lipids  Recent Labs  Lab 02/07/22 0921  CHOL 125  TRIG 23  HDL 40*  LDLCALC 80  CHOLHDL 3.1    Hematology Recent Labs  Lab 02/06/22 2250 02/07/22 0455  WBC 12.2* 8.3  RBC 3.93* 3.53*  HGB 12.5* 11.1*  HCT 37.2* 33.4*  MCV 94.7 94.6  MCH 31.8 31.4  MCHC 33.6 33.2  RDW 13.0 13.2  PLT 189 171   Thyroid  Recent Labs  Lab 02/06/22 2250 02/07/22 1255  TSH 4.508*  --   FREET4  --  0.99    BNPNo results for input(s): "BNP", "PROBNP" in the last 168 hours.  DDimer No results for input(s): "DDIMER" in the last 168 hours.   Radiology/Studies:  DG Chest Port 1 View  Result Date: 02/06/2022 CLINICAL DATA:  Syncope EXAM: PORTABLE CHEST 1 VIEW COMPARISON:  03/14/2004, CT 10/04/2021 FINDINGS: Recording device over left chest. No acute airspace disease or pleural effusion. Emphysema. Normal cardiac size. Aortic atherosclerosis IMPRESSION: No active disease. Emphysema. Electronically Signed   By: Donavan Foil M.D.   On: 02/06/2022 23:59  Assessment and Plan:   Syncope Symptomatic bradycardia SND No reversible causes No meds to contribute Labs are unremarkable Prior/recent ischemic w/u without obstructive CAD  Jamion Carter need PPM Discussed rational for PPM, implant  procedure, potential risks/benefits with the patient/wife at bedside, they are agreeable to proceed.   Risk Assessment/Risk Scores:     For questions or updates, please contact [place] Please consult [email or web address] for contact info under    Signed, Baldwin Jamaica, PA-C  02/08/2022 8:36 AM  I have seen and examined this patient with Tommye Standard.  Agree with above, note added to reflect my findings.  Patient presented to hospital with an episode of syncope.  He has had recurrent episodes of syncope.  He has an Abbott ILR which showed episodes of significant bradycardia.  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  No reversible cause.  Lewayne Pauley plan for pacemaker implant.  Risk and benefits have been discussed.  Risk include bleeding, tamponade, infection, pneumothorax, lead dislodgment.  He understands these risks and is agreed to the procedure.  Nicoles Sedlacek M. Keane Martelli MD 02/08/2022 1:46 PM

## 2022-02-08 NOTE — Consult Note (Addendum)
Cardiology Consultation   Patient ID: CHARLOTTE BRAFFORD MRN: [id number]; DOB: 08-Jul-1945  Admit date: 02/06/2022 Date of Consult: 02/08/2022  PCP:  [name], MD   [place] [facility] Cardiologist:  Rozann Lesches, MD  Electrophysiologist:  Melida Quitter, MD  {   Patient Profile:   IVY MERIWETHER is a 76 y.o. male with a hx of HLD, OA< and syncope who is being seen 02/08/2022 for the evaluation of symptomatic bradycardia at the request of Dr. Harl Bowie.  History of Present Illness:   Mr. Crymes has a hx of unexplained syncope, w/u initially unrevealing including a LHC with no obstructive CAD and an echo with preserved LVE, out patient monitoring with no findings to explain his syncope ultimately leading to placement of an ILR by Dr. Myles Gip last month.  He presented to [facility] yesterday early morning after another syncopal event. Reportedly was in his recliner, became diaphoretic > vomiting > LOC, wife called 911. Waking with perhaps some slurring of his words though no focal neuro deficits otherwise and then back to baseline.  No associate CP, palpitations or cardiac awareness before/during/after. This his 4th syncopal event   Interrogation of his loop did note marked SB leading to pausing of >5 seconds back-to-back w/junctional escape beats. EMS data is not available On arrival to the ER SB 50's, stable BP, and at his baseline with no ongoing symptoms.  Planned to transfer to [facility] for EP evaluation and likely PPM  LABS K+ 4.0 > 3.9 Mag 2.1 BUN/Creat 16/1.13 HS Trop 80 > 134 > 106 WBC 12.2 >> 8.3 H/H 11.1/33 Plts 171 TSH 4.508  Free T4 0.99  He continues to feel well currently   Past Medical History:  Diagnosis Date   Mild CAD    Cardiac catheterization August 2023   Mixed hyperlipidemia    Osteoarthritis     Past Surgical History:  Procedure Laterality Date   CERVICAL DISC SURGERY     x2   CERVICAL FUSION     Dr. Patrice Paradise 1'06 [facility]   HERNIA  REPAIR Left    open LIH/mesh 7''16   LEFT HEART CATH AND CORONARY ANGIOGRAPHY N/A 10/05/2021   Procedure: LEFT HEART CATH AND CORONARY ANGIOGRAPHY;  Surgeon: Burnell Blanks, MD;  Location: [place] [facility];  Service: Cardiovascular;  Laterality: N/A;   SHOULDER ARTHROSCOPY W/ ROTATOR CUFF REPAIR Right    stenosis intestines Right    age 32 "pyloric stenosis"   TOTAL HIP ARTHROPLASTY Left 03/14/2016   Procedure: LEFT TOTAL HIP ARTHROPLASTY ANTERIOR APPROACH;  Surgeon: Rod Can, MD;  Location: [facility];  Service: Orthopedics;  Laterality: Left;     Home Medications:  Prior to Admission medications   Medication Sig Start Date End Date Taking? Authorizing Provider  Calcium Carbonate Antacid (TUMS ULTRA 1000 PO) Take 2,000 mg by mouth at bedtime.   Yes [provider]  ELDERBERRY PO Take 3,200 mg by mouth as needed.   Yes [provider]  Omega-3 Fatty Acids (FISH OIL PO) Take 3 tablets by mouth at bedtime.   Yes [provider]  rosuvastatin (CRESTOR) 5 MG tablet Take 1 tablet (5 mg total) by mouth daily. 12/28/21 06/26/22 Yes Satira Sark, MD    Inpatient Medications: Scheduled Meds:  heparin  5,000 Units Subcutaneous Q8H   rosuvastatin  5 mg Oral Daily   Continuous Infusions:  PRN Meds: acetaminophen **OR** acetaminophen, ondansetron **OR** ondansetron (ZOFRAN) IV, oxyCODONE  Allergies:   No Known Allergies  Social History:   Social History   Socioeconomic History   Marital status: Married    Spouse name: Not on file   Number of children: Not on file   Years of education: Not on file   Highest education level: Not on file  Occupational History   Not on file  Tobacco Use   Smoking status: Former    Packs/day: 1.00    Years: 30.00    Total pack years: 30.00    Types: Cigarettes    Quit date: 03/08/2007    Years since quitting: 14.9   Smokeless tobacco: Never  Substance and Sexual Activity   Alcohol use: Yes    Alcohol/week:  1.0 standard drink of alcohol    Types: 1 Cans of beer per week    Comment: rare social 2-3 per month   Drug use: No   Sexual activity: Not on file  Other Topics Concern   Not on file  Social History Narrative   Not on file   Social Determinants of Health   Financial Resource Strain: Not on file  Food Insecurity: No Food Insecurity (02/07/2022)   Hunger Vital Sign    Worried About Running Out of Food in the Last Year: Never true    Ran Out of Food in the Last Year: Never true  Transportation Needs: No Transportation Needs (02/07/2022)   PRAPARE - Hydrologist (Medical): No    Lack of Transportation (Non-Medical): No  Physical Activity: Not on file  Stress: Not on file  Social Connections: Not on file  Intimate Partner Violence: Not At Risk (02/07/2022)   Humiliation, Afraid, Rape, and Kick questionnaire    Fear of Current or Ex-Partner: No    Emotionally Abused: No    Physically Abused: No    Sexually Abused: No    Family History:   Family History  Problem Relation Age of Onset   Alzheimer's disease Father      ROS:  Please see the history of present illness.  All other ROS reviewed and negative.     Physical Exam/Data:   Vitals:   02/07/22 1829 02/07/22 1949 02/08/22 0002 02/08/22 0454  BP: (!) 125/56 113/64 110/62 120/67  Pulse: 75 60 63 (!) 50  Resp: '18 18 18 18  '$ Temp: 98.3 F (36.8 C) 98 F (36.7 C) 98 F (36.7 C) 97.9 F (36.6 C)  TempSrc: Oral Oral Oral Oral  SpO2: 94% 94% 93% 94%  Weight: 77.2 kg   77 kg  Height: '5\' 10"'$  (1.778 m)       Intake/Output Summary (Last 24 hours) at 02/08/2022 0836 Last data filed at 02/07/2022 2005 Gross per 24 hour  Intake 1027.5 ml  Output --  Net 1027.5 ml      02/08/2022    4:54 AM 02/07/2022    6:29 PM 02/06/2022    9:27 PM  Last 3 Weights  Weight (lbs) 169 lb 12.8 oz 170 lb 4.8 oz 169 lb 12.8 oz  Weight (kg) 77.021 kg 77.248 kg 77.021 kg     Body mass index is 24.36 kg/m.   General:  Well nourished, well developed, in no acute distress HEENT: normal Neck: no JVD Vascular: No carotid bruits; Distal pulses 2+ bilaterally Cardiac:  RRR, soft SM, no gallops or rubs Lungs:  CTA b/l, no wheezing, rhonchi or rales  Abd: soft, nontender Ext: no edema Musculoskeletal:  No deformities Skin: warm and dry  Neuro:  no focal abnormalities noted Psych:  Normal affect   EKG:  The EKG was personally reviewed and demonstrates:    SB 55bpm, no acute/ischemic looking changes  Telemetry:  Telemetry was personally reviewed and demonstrates:   SB/SR 50's-60's, no recurrent pausing  Relevant CV Studies:  Echocardiogram: 10/2021 IMPRESSIONS   1. Left ventricular ejection fraction, by estimation, is 60 to 65%. The  left ventricle has normal function. The left ventricle has no regional  wall motion abnormalities. Left ventricular diastolic parameters are  consistent with Grade I diastolic  dysfunction (impaired relaxation).   2. Right ventricular systolic function is normal. The right ventricular  size is mildly enlarged. Tricuspid regurgitation signal is inadequate for  assessing PA pressure.   3. The mitral valve is normal in structure. No evidence of mitral valve  regurgitation. No evidence of mitral stenosis.   4. The aortic valve is abnormal. There is moderate calcification of the  aortic valve. Aortic valve regurgitation is not visualized. Aortic valve  sclerosis/calcification is present, without any evidence of aortic  stenosis.   5. The inferior vena cava is dilated in size with >50% respiratory  variability, suggesting right atrial pressure of 8 mmHg.    LHC: 10/2021   Prox RCA lesion is 20% stenosed.   Mid RCA lesion is 30% stenosed.   Mid LAD lesion is 30% stenosed.   Mid LAD to Dist LAD lesion is 30% stenosed.   Mild non-obstructive CAD Normal LV filling pressure Recommendations: No further ischemic workup. Medical management of CAD.      Event  Monitor: 10/2021 Impressions: 1 documented pause at 8:40 pm of 3 seconds with probable junctional escape beat. No documented symptoms during pause. In setting of syncope, consider EP consultation.  Laboratory Data:  High Sensitivity Troponin:   Recent Labs  Lab 02/06/22 2250 02/07/22 0212 02/07/22 0455  TROPONINIHS 80* 134* 106*     Chemistry Recent Labs  Lab 02/06/22 2250 02/07/22 0455  NA 139 137  K 4.0 3.9  CL 101 105  CO2 30 27  GLUCOSE 118* 142*  BUN 17 16  CREATININE 1.15 1.13  CALCIUM 9.3 8.8*  MG  --  2.1  GFRNONAA >60 >60  ANIONGAP 8 5    Recent Labs  Lab 02/06/22 2250 02/07/22 0455  PROT 7.4 6.5  ALBUMIN 3.8 3.4*  AST 21 24  ALT 20 18  ALKPHOS 69 59  BILITOT 0.4 0.5   Lipids  Recent Labs  Lab 02/07/22 0921  CHOL 125  TRIG 23  HDL 40*  LDLCALC 80  CHOLHDL 3.1    Hematology Recent Labs  Lab 02/06/22 2250 02/07/22 0455  WBC 12.2* 8.3  RBC 3.93* 3.53*  HGB 12.5* 11.1*  HCT 37.2* 33.4*  MCV 94.7 94.6  MCH 31.8 31.4  MCHC 33.6 33.2  RDW 13.0 13.2  PLT 189 171   Thyroid  Recent Labs  Lab 02/06/22 2250 02/07/22 1255  TSH 4.508*  --   FREET4  --  0.99    BNPNo results for input(s): "BNP", "PROBNP" in the last 168 hours.  DDimer No results for input(s): "DDIMER" in the last 168 hours.   Radiology/Studies:  DG Chest Port 1 View  Result Date: 02/06/2022 CLINICAL DATA:  Syncope EXAM: PORTABLE CHEST 1 VIEW COMPARISON:  03/14/2004, CT 10/04/2021 FINDINGS: Recording device over left chest. No acute airspace disease or pleural effusion. Emphysema. Normal cardiac size. Aortic atherosclerosis IMPRESSION: No active disease. Emphysema. Electronically Signed   By: Donavan Foil M.D.   On: 02/06/2022 23:59  Assessment and Plan:   Syncope Symptomatic bradycardia SND No reversible causes No meds to contribute Labs are unremarkable Prior/recent ischemic w/u without obstructive CAD  Genni Buske need PPM Discussed rational for PPM, implant  procedure, potential risks/benefits with the patient/wife at bedside, they are agreeable to proceed.   Risk Assessment/Risk Scores:     For questions or updates, please contact [place] Please consult [email or web address] for contact info under    Signed, Baldwin Jamaica, PA-C  02/08/2022 8:36 AM  I have seen and examined this patient with Tommye Standard.  Agree with above, note added to reflect my findings.  Patient presented to hospital with an episode of syncope.  He has had recurrent episodes of syncope.  He has an Abbott ILR which showed episodes of significant bradycardia.  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  No reversible cause.  Shataria Crist plan for pacemaker implant.  Risk and benefits have been discussed.  Risk include bleeding, tamponade, infection, pneumothorax, lead dislodgment.  He understands these risks and is agreed to the procedure.  Tresea Heine M. Trinidy Masterson MD 02/08/2022 1:46 PM

## 2022-02-09 ENCOUNTER — Inpatient Hospital Stay (HOSPITAL_COMMUNITY): Payer: PPO

## 2022-02-09 DIAGNOSIS — R7989 Other specified abnormal findings of blood chemistry: Secondary | ICD-10-CM | POA: Insufficient documentation

## 2022-02-09 DIAGNOSIS — R001 Bradycardia, unspecified: Secondary | ICD-10-CM | POA: Insufficient documentation

## 2022-02-09 DIAGNOSIS — I442 Atrioventricular block, complete: Secondary | ICD-10-CM

## 2022-02-09 DIAGNOSIS — R55 Syncope and collapse: Secondary | ICD-10-CM | POA: Diagnosis not present

## 2022-02-09 DIAGNOSIS — I2510 Atherosclerotic heart disease of native coronary artery without angina pectoris: Secondary | ICD-10-CM

## 2022-02-09 NOTE — Discharge Summary (Signed)
Discharge Summary    Patient ID: Theodore Hutchinson MRN: [id number]; DOB: 03-02-1946  Admit date: 02/06/2022 Discharge date: 02/09/2022  PCP:  [name], MD   [place] [facility] Cardiologist:  Rozann Lesches, MD  Electrophysiologist:  Melida Quitter, MD  {     Discharge Diagnoses    Principal Problem:   Syncope and collapse Active Problems:   Sinus pause   Symptomatic bradycardia   Elevated troponin   Mild non-obstructive CAD   HLD (hyperlipidemia)   Elevated TSH    Diagnostic Studies/Procedures    Pacemaker Implantation 02/08/2022: Conclusions:  1. Successful implantation of a Biotronik [place] [id number] (dual) dual-chamber pacemaker for symptomatic bradycardia  2. No early apparent complications.  _____________   History of Present Illness     Theodore Hutchinson is a 76 y.o. male with a history of mild CAD noted on cardiac catheterization in 10/2021, recurrent syncope s/p loop recorder which showed significant pauses, and hyperlipidemia who was admitted to [facility] on 02/06/2022 for syncope.  Patient was admitted to [facility] in 10/2021 for evaluation of a syncopal episode that occurred while patient was at home watching TV. Initial EKG showed ST elevation in the inferior leads but subsequent tracings showed no significant ST change. High-sensitivity troponin trended up to 143 so patient underwent cardiac catheterization at that time which showed only mild non-obstructive CAD. Echo showed LVEF of 60-65% with no regional wall motion abnormalities and no significant valvular disease. Outpatient monitor showed a 3 second pause with probable junctional escape beat and 1 run of SVT lasting on 4 beats as well as rare PACs/ PVCs. Given recurrent syncope, he was referred to EP and underwent placement of implantable loop recorder on 01/17/2022.   Patient presented to the [facility] ED on 02/06/2022 for further evaluation of another syncopal episode which  occurred while he was sitting in his recliner. He was in his usual states of health until that evening when he became diaphoretic and vomited multiple times. He initially stated that he did not lose consciousness but later reported that he did not remember his wife calling EMS or the events surrounding this. His wife told him he was slurring his words at that time but had no facial droop. No bladder or bowel incontinence.   Initial labs showed WBC 12.2, Hgb 12.5, platelets 189, Na+ 139, K+ 4.0 and creatinine 1.15. TSH 4.508. Magnesium normal at 2.1. Initial and repeat troponin values have been elevated at 80, 134 and 106. CXR with no active disease. EKG shows sinus bradycardia, heart rate 55 with no acute ST changes. He was admitted at [facility] and Cardiology consulted. Patient wife reported sending in a remote transmission around time of syncopal episode. Cardiology team spoke with [place] who confirmed this and stated it showed normal sinus rhythm with rates in the 60s to 70s. Initial plan was for discharge with outpatient Neurology referral. However, our team was then notified by EP and the device team that the initial report we received were from the patient triggered events and did not capture the preceding rhythm. Full review of loop recorder showed significant pauses greater than 5 seconds. Therefore, discharge was cancelled and patient was transferred to [facility]     Consultants: EP   Symptomatic Bradycardia Sinus Pauses Recurrent Syncope Patient was presented to [facility] on 02/06/2022 for further evaluation of another syncopal episode. Interrogation of loop recorder showed sinus bradycardia  leading to prolonged pauses > 5 seconds back to back with junctional escape beats. Therefore, he was transferred to [facility] for EP evaluation. He underwent successful placement of a Biotronik dual chamber pacemaker on 02/08/2022. He tolerated the procedure well  with no apparent complications. He was kept overnight for observation. He is doing well on morning of discharge. Chest x-ray showed no evidence of pneumothorax and device interrogation showed stable device function/ position. He was felt to be stable for discharge. Activity restrictions were reviewed with patient and included in discharge paperwork.   Elevated Troponin  Mild Non-Obstructive CAD LHC in 10/2021 showed only mild non-obstructive CAD with 20-30% stenosis of RCA and LAD. High-sensitivity troponin was mildly elevated on admission peaking at 134. He denied any anginal symptoms. Given recent cath, no additional ischemic evaluation was indicated. Continue statin therapy. He has not been on Aspirin.  Hyperlipidemia Lipid panel this admission showed Total Cholesterol 125, Triglycerides 23, HDL 40, LDL 80. LDL had improved from 124 in 10/2021 after starting statin. LDL goal <70. He has had intolerance to Lipitor in the past. Will continue home Crestor '5mg'$  daily. Can discuss up-titrating this as an outpatient.  Elevated TSH TSH was noted to be slightly elevated at 4.508 (upper limit of normal 4.5). However, free T4 was normal. Follow-up with PCP.   Patient was seen and examined by Dr. Quentin Ore today and felt to be stable for discharge. Will send EP schedulers a message to help arrange wound check and routine follow-up. Medications as below.    Did the patient have an acute coronary syndrome (MI, NSTEMI, STEMI, etc) this admission?:  No.   The elevated Troponin was due to the acute medical illness (demand ischemia).   _____________  Discharge Vitals Blood pressure (!) 144/72, pulse 65, temperature 97.7 F (36.5 C), temperature source Oral, resp. rate 20, height '5\' 10"'$  (1.778 m), weight 78.9 kg, SpO2 100 %.  Filed Weights   02/07/22 1829 02/08/22 0454 02/09/22 0033  Weight: 77.2 kg 77 kg 78.9 kg    Labs & Radiologic Studies    CBC Recent Labs    02/06/22 2250 02/07/22 0455  WBC  12.2* 8.3  NEUTROABS 9.9* 6.1  HGB 12.5* 11.1*  HCT 37.2* 33.4*  MCV 94.7 94.6  PLT 189 025   Basic Metabolic Panel Recent Labs    02/06/22 2250 02/07/22 0455  NA 139 137  K 4.0 3.9  CL 101 105  CO2 30 27  GLUCOSE 118* 142*  BUN 17 16  CREATININE 1.15 1.13  CALCIUM 9.3 8.8*  MG  --  2.1   Liver Function Tests Recent Labs    02/06/22 2250 02/07/22 0455  AST 21 24  ALT 20 18  ALKPHOS 69 59  BILITOT 0.4 0.5  PROT 7.4 6.5  ALBUMIN 3.8 3.4*   No results for input(s): "LIPASE", "AMYLASE" in the last 72 hours. High Sensitivity Troponin:   Recent Labs  Lab 02/06/22 2250 02/07/22 0212 02/07/22 0455  TROPONINIHS 80* 134* 106*    BNP Invalid input(s): "POCBNP" D-Dimer No results for input(s): "DDIMER" in the last 72 hours. Hemoglobin A1C No results for input(s): "HGBA1C" in the last 72 hours. Fasting Lipid Panel Recent Labs    02/07/22 0921  CHOL 125  HDL 40*  LDLCALC 80  TRIG 23  CHOLHDL 3.1   Thyroid Function Tests Recent Labs    02/06/22 2250  TSH 4.508*   _____________  DG Chest 2 View  Result Date: 02/09/2022 CLINICAL DATA:  Pacemaker placement. EXAM: CHEST - 2 VIEW COMPARISON:  02/06/2022. FINDINGS: Cardiac silhouette normal in size. No mediastinal or hilar masses. No evidence of adenopathy. Lungs are hyperexpanded. Minor linear scarring at the left lung base. Lungs otherwise clear. No pleural effusion or pneumothorax. Left anterior chest wall dual lead pacemaker, leads projecting in the right atrium and right ventricle. Skeletal structures are intact. IMPRESSION: 1. Well-positioned dual lead left anterior chest wall pacemaker. 2. No acute cardiopulmonary disease. Electronically Signed   By: Lajean Manes M.D.   On: 02/09/2022 08:03   EP PPM/ICD IMPLANT  Result Date: 02/08/2022 SURGEON:  Allegra Lai, MD   PREPROCEDURE DIAGNOSIS:  syncope, symptomatic bradycardia   POSTPROCEDURE DIAGNOSIS:  syncope, symptomatic bradycardia    PROCEDURES:  1.  Pacemaker implantation.   INTRODUCTION:  Theodore Hutchinson is a 76 y.o. male with a history of bradycardia who presents today for pacemaker implantation.  The patient reports intermittent episodes of dizziness over the past few months.  No reversible causes have been identified.  The patient therefore presents today for pacemaker implantation.   DESCRIPTION OF PROCEDURE:  Informed written consent was obtained, and  the patient was brought to the electrophysiology lab in a fasting state.  The patient required no sedation for the procedure today.  The patients left chest was prepped and draped in the usual sterile fashion by the EP lab staff. The skin overlying the left deltopectoral region was infiltrated with lidocaine for local analgesia.  A 4-cm incision was made over the left deltopectoral region.  A left subcutaneous pacemaker pocket was fashioned using a combination of sharp and blunt dissection. Electrocautery was required to assure hemostasis.   RA/RV Lead Placement: The left axillary vein was therefore cannulated.  Through the left axillary vein, a Biotronik Solia [id number] (RA)  (serial number  [id number]) right atrial lead and an [place] [phone] (RV) (serial number  [id number]) right ventricular lead were advanced with fluoroscopic visualization into the right atrial appendage and right ventricular apex positions respectively.  Initial atrial lead P- waves measured 7.8 mV with impedance of 646 ohms and a threshold of 0.8 V at 0.5 msec.  Right ventricular lead R-waves measured 6.5 mV with an impedance of 782 ohms and a threshold of 1 V at 0.5 msec.  Both leads were secured to the pectoralis fascia using #2-0 silk over the suture sleeves. Device Placement:  The leads were then connected to an [place] [id number] (dual)  (serial number  [id number] ) pacemaker.  The pocket was irrigated with copious gentamicin solution.  The pacemaker was then placed into the pocket.  The pocket was then closed in 3  layers with 2.0 Vicryl suture for the 3.0 Vicryl suture subcutaneous and subcuticular layers.  Steri-  Strips and a sterile dressing were then applied. EBL<30m.  There were no early apparent complications.   CONCLUSIONS:  1. Successful implantation of a Biotronik [place] [phone](dual) dual-chamber pacemaker for symptomatic bradycardia  2. No early apparent complications.       Will CCurt Bears MD 02/08/2022 4:07 PM  DG Chest Port 1 View  Result Date: 02/06/2022 CLINICAL DATA:  Syncope EXAM: PORTABLE CHEST 1 VIEW COMPARISON:  03/14/2004, CT 10/04/2021 FINDINGS: Recording device over left chest. No acute airspace disease or pleural effusion. Emphysema. Normal cardiac size. Aortic atherosclerosis IMPRESSION: No active disease. Emphysema. Electronically Signed   By: KDonavan FoilM.D.   On: 02/06/2022 23:59   Disposition   Patient is being discharged home today in good condition.  Follow-up Plans & Appointments     Follow-up Information     [name], MD. Schedule an appointment as soon as possible for a visit in 1 week(s).   Specialty: Internal Medicine Why: Hospital Follow Up Contact information: [address] [place] [address] 371 [phone]         Mealor, Yetta Barre, MD Follow up.   Specialty: Cardiology Why: Our office will call you to schedule a wound check appointment. If you do not hear from [place] within 2 business days, please call our office to schedule this. Contact information: [address] [place] [address] [phone]                Discharge Instructions     Diet - low sodium heart healthy   Complete by: As directed    Discharge wound care:   Complete by: As directed    Continue wound care as listed in discharge summary.   Increase activity slowly   Complete by: As directed         Discharge Medications   Allergies as of 02/09/2022   No Known Allergies      Medication List     TAKE these medications    ELDERBERRY  PO Take 3,200 mg by mouth as needed.   FISH OIL PO Take 3 tablets by mouth at bedtime.   rosuvastatin 5 MG tablet Commonly known as: CRESTOR Take 1 tablet (5 mg total) by mouth daily.   TUMS ULTRA 1000 PO Take 2,000 mg by mouth at bedtime.               Discharge Care Instructions  (From admission, onward)           Start     Ordered   02/09/22 0000  Discharge wound care:       Comments: Continue wound care as listed in discharge summary.   02/09/22 1029               Outstanding Labs/Studies   N/A  Duration of Discharge Encounter   Greater than 30 minutes including physician time.  Signed, Darreld Mclean, PA-C 02/09/2022, 11:03 AM

## 2022-02-09 NOTE — Progress Notes (Signed)
   02/09/22 1100  Mobility  Activity Ambulated independently in room  Level of Assistance Independent  Assistive Device None  Distance Ambulated (ft) 20 ft  Activity Response Tolerated well  Mobility Referral Yes  $Mobility charge 1 Mobility   Mobility Specialist Progress Note  Received pt in chair having no complaints and agreeable to mobility. Pt was asymptomatic throughout ambulation and returned to room w/o fault. Left in EOB  w/ call bell in reach and all needs met.   Lucious Groves Mobility Specialist  Please contact via SecureChat or Rehab office at [phone]

## 2022-02-09 NOTE — Progress Notes (Signed)
Rounding Note    Patient Name: Theodore Hutchinson Date of Encounter: 02/09/2022  [place] Cardiologist: Rozann Lesches, MD   Subjective   NAEO. Feeling well after PPM yesterday.  Inpatient Medications    Scheduled Meds:  heparin  5,000 Units Subcutaneous Q8H   rosuvastatin  5 mg Oral Daily   sodium chloride flush  3 mL Intravenous Q12H   Continuous Infusions:   ceFAZolin (ANCEF) IV Stopped (02/09/22 0410)   PRN Meds: acetaminophen, ondansetron **OR** ondansetron (ZOFRAN) IV, ondansetron (ZOFRAN) IV, oxyCODONE   Vital Signs    Vitals:   02/08/22 2100 02/09/22 0033 02/09/22 0600 02/09/22 0840  BP:  (!) 122/57 127/77 (!) 144/72  Pulse:  61 62 65  Resp:  20    Temp:  98.6 F (37 C)  97.7 F (36.5 C)  TempSrc:  Oral  Oral  SpO2: 94% 92%  100%  Weight:  78.9 kg    Height:        Intake/Output Summary (Last 24 hours) at 02/09/2022 1017 Last data filed at 02/09/2022 0845 Gross per 24 hour  Intake 639.61 ml  Output --  Net 639.61 ml      02/09/2022   12:33 AM 02/08/2022    4:54 AM 02/07/2022    6:29 PM  Last 3 Weights  Weight (lbs) 173 lb 15.1 oz 169 lb 12.8 oz 170 lb 4.8 oz  Weight (kg) 78.9 kg 77.021 kg 77.248 kg      Telemetry    Personally Reviewed  ECG    Personally Reviewed  Physical Exam   GEN: No acute distress.   Neck: No JVD Cardiac: RRR, no murmurs, rubs, or gallops. Prepectoral pocket healin well. Respiratory: Clear to auscultation bilaterally. GI: Soft, nontender, non-distended  MS: No edema; No deformity. Neuro:  Nonfocal  Psych: Normal affect   Labs    High Sensitivity Troponin:   Recent Labs  Lab 02/06/22 2250 02/07/22 0212 02/07/22 0455  TROPONINIHS 80* 134* 106*     Chemistry Recent Labs  Lab 02/06/22 2250 02/07/22 0455  NA 139 137  K 4.0 3.9  CL 101 105  CO2 30 27  GLUCOSE 118* 142*  BUN 17 16  CREATININE 1.15 1.13  CALCIUM 9.3 8.8*  MG  --  2.1  PROT 7.4 6.5  ALBUMIN 3.8 3.4*  AST 21 24  ALT  20 18  ALKPHOS 69 59  BILITOT 0.4 0.5  GFRNONAA >60 >60  ANIONGAP 8 5    Lipids  Recent Labs  Lab 02/07/22 0921  CHOL 125  TRIG 23  HDL 40*  LDLCALC 80  CHOLHDL 3.1    Hematology Recent Labs  Lab 02/06/22 2250 02/07/22 0455  WBC 12.2* 8.3  RBC 3.93* 3.53*  HGB 12.5* 11.1*  HCT 37.2* 33.4*  MCV 94.7 94.6  MCH 31.8 31.4  MCHC 33.6 33.2  RDW 13.0 13.2  PLT 189 171   Thyroid  Recent Labs  Lab 02/06/22 2250 02/07/22 1255  TSH 4.508*  --   FREET4  --  0.99    BNPNo results for input(s): "BNP", "PROBNP" in the last 168 hours.  DDimer No results for input(s): "DDIMER" in the last 168 hours.   Radiology    DG Chest 2 View  Result Date: 02/09/2022 CLINICAL DATA:  Pacemaker placement. EXAM: CHEST - 2 VIEW COMPARISON:  02/06/2022. FINDINGS: Cardiac silhouette normal in size. No mediastinal or hilar masses. No evidence of adenopathy. Lungs are hyperexpanded. Minor linear scarring at the left  lung base. Lungs otherwise clear. No pleural effusion or pneumothorax. Left anterior chest wall dual lead pacemaker, leads projecting in the right atrium and right ventricle. Skeletal structures are intact. IMPRESSION: 1. Well-positioned dual lead left anterior chest wall pacemaker. 2. No acute cardiopulmonary disease. Electronically Signed   By: Lajean Manes M.D.   On: 02/09/2022 08:03   EP PPM/ICD IMPLANT  Result Date: 02/08/2022 SURGEON:  Allegra Lai, MD   PREPROCEDURE DIAGNOSIS:  syncope, symptomatic bradycardia   POSTPROCEDURE DIAGNOSIS:  syncope, symptomatic bradycardia    PROCEDURES:  1. Pacemaker implantation.   INTRODUCTION:  Theodore Hutchinson is a 76 y.o. male with a history of bradycardia who presents today for pacemaker implantation.  The patient reports intermittent episodes of dizziness over the past few months.  No reversible causes have been identified.  The patient therefore presents today for pacemaker implantation.   DESCRIPTION OF PROCEDURE:  Informed written consent was  obtained, and  the patient was brought to the electrophysiology lab in a fasting state.  The patient required no sedation for the procedure today.  The patients left chest was prepped and draped in the usual sterile fashion by the EP lab staff. The skin overlying the left deltopectoral region was infiltrated with lidocaine for local analgesia.  A 4-cm incision was made over the left deltopectoral region.  A left subcutaneous pacemaker pocket was fashioned using a combination of sharp and blunt dissection. Electrocautery was required to assure hemostasis.   RA/RV Lead Placement: The left axillary vein was therefore cannulated.  Through the left axillary vein, a Biotronik Solia [id number] (RA)  (serial number  [id number]) right atrial lead and an [place] [id number] (RV) (serial number  [id number]) right ventricular lead were advanced with fluoroscopic visualization into the right atrial appendage and right ventricular apex positions respectively.  Initial atrial lead P- waves measured 7.8 mV with impedance of 646 ohms and a threshold of 0.8 V at 0.5 msec.  Right ventricular lead R-waves measured 6.5 mV with an impedance of 782 ohms and a threshold of 1 V at 0.5 msec.  Both leads were secured to the pectoralis fascia using #2-0 silk over the suture sleeves. Device Placement:  The leads were then connected to an [place] [id number] (dual)  (serial number  [id number] ) pacemaker.  The pocket was irrigated with copious gentamicin solution.  The pacemaker was then placed into the pocket.  The pocket was then closed in 3 layers with 2.0 Vicryl suture for the 3.0 Vicryl suture subcutaneous and subcuticular layers.  Steri-  Strips and a sterile dressing were then applied. EBL<10m.  There were no early apparent complications.   CONCLUSIONS:  1. Successful implantation of a Biotronik [place] [id number](dual) dual-chamber pacemaker for symptomatic bradycardia  2. No early apparent complications.       Will CCurt Bears MD 02/08/2022  4:07 PM    Assessment & Plan    #CHB #Syncope Doing well after PPM implant yesterday. CXR and device interrogation show stable device function/position. OK to discharge.    For questions or updates, please contact CMineolaPlease consult [email or web address] for contact info under        Signed, CVickie Epley MD  02/09/2022, 10:17 AM

## 2022-02-09 NOTE — Progress Notes (Addendum)
Nsg Discharge Note  Admit Date:  02/06/2022 Discharge date: 02/09/2022   [place] Both to be D/C'd Home per MD order.  AVS completed.  Patient/caregiver able to verbalize understanding.  Discharge Medication: Allergies as of 02/09/2022   No Known Allergies      Medication List     TAKE these medications    ELDERBERRY PO Take 3,200 mg by mouth as needed.   FISH OIL PO Take 3 tablets by mouth at bedtime.   rosuvastatin 5 MG tablet Commonly known as: CRESTOR Take 1 tablet (5 mg total) by mouth daily.   TUMS ULTRA 1000 PO Take 2,000 mg by mouth at bedtime.               Discharge Care Instructions  (From admission, onward)           Start     Ordered   02/09/22 0000  Discharge wound care:       Comments: Continue wound care as listed in discharge summary.   02/09/22 1029            Discharge Assessment: Vitals:   02/09/22 0600 02/09/22 0840  BP: 127/77 (!) 144/72  Pulse: 62 65  Resp:    Temp:  97.7 F (36.5 C)  SpO2:  100%   Skin clean, dry and intact without evidence of skin break down, no evidence of skin tears noted. IV catheter discontinued intact. Site without signs and symptoms of complications - no redness or edema noted at insertion site, patient denies c/o pain - only slight tenderness at site.  Dressing with slight pressure applied.  D/c Instructions-Education: Discharge instructions given to patient/family with verbalized understanding. D/c education completed with patient/family including follow up instructions, medication list, d/c activities limitations if indicated, with other d/c instructions as indicated by MD - patient able to verbalize understanding, all questions fully answered. Patient instructed to return to ED, call 911, or call MD for any changes in condition.   D/C home via private auto.  Sayan Aldava, Jolene Schimke, RN 02/09/2022 12:07 PM

## 2022-02-09 NOTE — Plan of Care (Signed)
  Problem: Health Behavior/Discharge Planning: Goal: Ability to manage health-related needs will improve Outcome: Completed/Met

## 2022-02-11 ENCOUNTER — Encounter (HOSPITAL_COMMUNITY): Payer: Self-pay | Admitting: Cardiology

## 2022-02-12 DIAGNOSIS — I700 Atherosclerosis of aorta: Secondary | ICD-10-CM | POA: Diagnosis not present

## 2022-02-12 DIAGNOSIS — E782 Mixed hyperlipidemia: Secondary | ICD-10-CM | POA: Diagnosis not present

## 2022-02-12 DIAGNOSIS — I10 Essential (primary) hypertension: Secondary | ICD-10-CM | POA: Diagnosis not present

## 2022-02-12 DIAGNOSIS — R55 Syncope and collapse: Secondary | ICD-10-CM | POA: Diagnosis not present

## 2022-02-12 DIAGNOSIS — R5383 Other fatigue: Secondary | ICD-10-CM | POA: Diagnosis not present

## 2022-02-12 DIAGNOSIS — Z6824 Body mass index (BMI) 24.0-24.9, adult: Secondary | ICD-10-CM | POA: Diagnosis not present

## 2022-02-13 ENCOUNTER — Telehealth: Payer: Self-pay | Admitting: Cardiology

## 2022-02-13 NOTE — Telephone Encounter (Signed)
Patient has a newly placed Biotronik PPM on 02/08/2022.  Wife reports patient was sitting up started to feel dizzy and had syncopal event. Reports when he passed out he urinated on himself and vomited a small amount. Denies any falling from chair or injury. States it felt similar to previous events he has experienced. Pacemaker is set to 50 bmp. No alerts noted in Biotronik remote monitoring which was last updated 02/13/2022 @ 01:14 AM. Reports today he has felt well and has no complaints at this time.   Patient advised NOT to drive. ED precautions given if patient passes out again or has concerning symptoms to make sure he goes to the ER for evaluation. Patient/wife voiced understanding.   Secure chat sent to Dr. Myles Gip who is in the hospital to please review phone note from today for recommendations.

## 2022-02-13 NOTE — Telephone Encounter (Signed)
Patients wife called and following recommendations from Dr. Myles Gip. Voiced understanding.

## 2022-02-13 NOTE — Telephone Encounter (Signed)
  Pt's wife calling, she said, pt been experience some pause and his heart beat and low HR. She wanted to what she needs to do. She said, the last episode the pt has was yesterday

## 2022-02-19 ENCOUNTER — Ambulatory Visit: Payer: PPO | Attending: Internal Medicine

## 2022-02-19 DIAGNOSIS — I455 Other specified heart block: Secondary | ICD-10-CM

## 2022-02-19 LAB — CUP PACEART INCLINIC DEVICE CHECK
Battery Remaining Longevity: 106 mo
Brady Statistic RA Percent Paced: 38 %
Brady Statistic RV Percent Paced: 0 %
Date Time Interrogation Session: 20231219124038
Implantable Lead Connection Status: 753985
Implantable Lead Connection Status: 753985
Implantable Lead Implant Date: 20231208
Implantable Lead Implant Date: 20231208
Implantable Lead Location: 753859
Implantable Lead Location: 753860
Implantable Lead Model: 377
Implantable Lead Model: 377
Implantable Lead Serial Number: 8001170061
Implantable Lead Serial Number: 8001193938
Implantable Pulse Generator Implant Date: 20231208
Lead Channel Impedance Value: 526 Ohm
Lead Channel Impedance Value: 565 Ohm
Lead Channel Pacing Threshold Amplitude: 0.6 V
Lead Channel Pacing Threshold Amplitude: 1.2 V
Lead Channel Pacing Threshold Pulse Width: 0.4 ms
Lead Channel Pacing Threshold Pulse Width: 0.4 ms
Lead Channel Sensing Intrinsic Amplitude: 4.7 mV
Lead Channel Sensing Intrinsic Amplitude: 5.7 mV
Pulse Gen Model: 407145
Pulse Gen Serial Number: 1000112021

## 2022-02-19 NOTE — Progress Notes (Signed)
Wound check appointment.  Device check performed by [place] with Biotronik. Device function normal. . Steri-strips removed. Wound without redness or edema. Incision edges approximated, wound well healed. Normal device function. Thresholds, sensing, and impedances consistent with implant measurements. Device programmed at 3.5V/auto capture programmed on for extra safety margin until 3 month visit. Histogram distribution appropriate for patient and level of activity. No mode switches or high ventricular rates noted. Patient educated about wound care, arm mobility, lifting restrictions. ROV in 3 months with implanting physician.  NOTE: patient reports another syncopal event that occurred on 12/12 after receiving PPM on 12/8. Nothing noted on device that correlates.  Patient symptoms: brief loss of consciousness, lightheaded, dizzy, vomiting and voiding on self after regaining consciousness. (very similar to prior events although he and wife both agree it was much milder than prior syncopal events).   Reviewed event with Dr. Myles Gip who recommends the following changes to device today: 1.  change LRL from 50 to 60 2. Increase sensitivity on CLS from medium to high 3.  Upper response rate (2:1 rate) increased from 109 to 120 4.  AV delays and AV delays after sensed changed from 365m to 250 (adjustment made by Pavan to promote better AV synchrony.  5.  Follow up program count - changed from 11 to 15 Patient given number to device clinic if any concerns or questions since adjustments made today.  Patient verbalizes understanding

## 2022-02-19 NOTE — Patient Instructions (Addendum)
   After Your Pacemaker   Monitor your pacemaker site for redness, swelling, and drainage. Call the device clinic at [phone] if you experience these symptoms or fever/chills.  Your incision was closed with Steri-strips or staples:  You may shower 7 days after your procedure and wash your incision with soap and water. Avoid lotions, ointments, or perfumes over your incision until it is well-healed.  You may use a hot tub or a pool after your wound check appointment if the incision is completely closed.  Do not lift, push or pull greater than 10 pounds with the affected arm until 6 weeks after your procedure. There are no other restrictions in arm movement after your wound check appointment.  Until After January 19th.   You may drive, unless driving has been restricted by your healthcare providers.   Remote monitoring is used to monitor your pacemaker from home. This monitoring is scheduled every 91 days by our office. It allows [place] to keep an eye on the functioning of your device to ensure it is working properly. You will routinely see your Electrophysiologist annually (more often if necessary).

## 2022-02-21 ENCOUNTER — Ambulatory Visit: Payer: PPO

## 2022-03-26 ENCOUNTER — Other Ambulatory Visit: Payer: Self-pay | Admitting: Cardiology

## 2022-03-28 ENCOUNTER — Other Ambulatory Visit: Payer: Self-pay

## 2022-03-28 ENCOUNTER — Emergency Department (HOSPITAL_COMMUNITY)
Admission: EM | Admit: 2022-03-28 | Discharge: 2022-03-29 | Disposition: A | Discharge status: Home / Self Care | Payer: PPO | Attending: Emergency Medicine | Admitting: Emergency Medicine

## 2022-03-28 ENCOUNTER — Encounter (HOSPITAL_COMMUNITY): Payer: Self-pay | Admitting: Emergency Medicine

## 2022-03-28 DIAGNOSIS — I959 Hypotension, unspecified: Secondary | ICD-10-CM | POA: Diagnosis not present

## 2022-03-28 DIAGNOSIS — R55 Syncope and collapse: Secondary | ICD-10-CM | POA: Insufficient documentation

## 2022-03-28 DIAGNOSIS — R231 Pallor: Secondary | ICD-10-CM | POA: Diagnosis not present

## 2022-03-28 DIAGNOSIS — R110 Nausea: Secondary | ICD-10-CM | POA: Diagnosis not present

## 2022-03-28 DIAGNOSIS — R531 Weakness: Secondary | ICD-10-CM | POA: Diagnosis not present

## 2022-03-28 DIAGNOSIS — R61 Generalized hyperhidrosis: Secondary | ICD-10-CM | POA: Diagnosis not present

## 2022-03-28 DIAGNOSIS — R42 Dizziness and giddiness: Secondary | ICD-10-CM | POA: Diagnosis not present

## 2022-03-28 DIAGNOSIS — R0989 Other specified symptoms and signs involving the circulatory and respiratory systems: Secondary | ICD-10-CM | POA: Diagnosis not present

## 2022-03-28 DIAGNOSIS — R112 Nausea with vomiting, unspecified: Secondary | ICD-10-CM | POA: Diagnosis not present

## 2022-03-28 DIAGNOSIS — Z8679 Personal history of other diseases of the circulatory system: Secondary | ICD-10-CM

## 2022-03-28 MED ORDER — SODIUM CHLORIDE 0.9 % IV BOLUS
1000.0000 mL | Freq: Once | INTRAVENOUS | Status: AC
  Administered 2022-03-29: 1000 mL via INTRAVENOUS

## 2022-03-28 NOTE — ED Provider Notes (Signed)
[facility] Provider Note   [name]: [id number] Arrival date & time: 03/28/22  2243     History  No chief complaint on file.   Theodore Hutchinson is a 77 y.o. male.  Patient is a 77 year old male with past medical history of sick sinus syndrome with pacemaker placement.  Patient has been experiencing episodes of dizziness, weakness, nausea, diaphoresis, and syncope for many months.  Several months ago, a loop recorder was implanted and patient was found to become severely bradycardic requiring pacemaker placement.  Episodes have decreased in frequency, but still seem to occur.  This evening he began to feel "hot and flushed", sweaty, nauseated, and 1 episode of vomiting.  He was found by EMS to have low blood pressure and started on IV fluids.  Patient states symptoms have nearly resolved prior to arrival.  He denies any fevers or chills.  He denies any other complaints.  The history is provided by the patient.       Home Medications Prior to Admission medications   Medication Sig Start Date End Date Taking? Authorizing Provider  Calcium Carbonate Antacid (TUMS ULTRA 1000 PO) Take 2,000 mg by mouth at bedtime.    [provider]  ELDERBERRY PO Take 3,200 mg by mouth as needed.    [provider]  Omega-3 Fatty Acids (FISH OIL PO) Take 3 tablets by mouth at bedtime.    [provider]  rosuvastatin (CRESTOR) 5 MG tablet TAKE ONE TABLET BY MOUTH ONCE DAILY 03/26/22   Satira Sark, MD      Allergies    Patient has no known allergies.    Review of Systems   Review of Systems  All other systems reviewed and are negative.   Physical Exam Updated Vital Signs BP 134/79 (BP Location: Left Arm)   Pulse 73   Temp 97.6 F (36.4 C) (Oral)   Resp 19   Ht '5\' 10"'$  (1.778 m)   Wt 77.1 kg   SpO2 98%   BMI 24.39 kg/m  Physical Exam Vitals and nursing note reviewed.  Constitutional:      General: He is not in acute  distress.    Appearance: He is well-developed. He is not diaphoretic.  HENT:     Head: Normocephalic and atraumatic.     Mouth/Throat:     Mouth: Mucous membranes are moist.  Eyes:     Extraocular Movements: Extraocular movements intact.     Pupils: Pupils are equal, round, and reactive to light.  Cardiovascular:     Rate and Rhythm: Normal rate and regular rhythm.     Heart sounds: No murmur heard.    No friction rub.  Pulmonary:     Effort: Pulmonary effort is normal. No respiratory distress.     Breath sounds: Normal breath sounds. No wheezing or rales.  Abdominal:     General: Bowel sounds are normal. There is no distension.     Palpations: Abdomen is soft.     Tenderness: There is no abdominal tenderness.  Musculoskeletal:        General: Normal range of motion.     Cervical back: Normal range of motion and neck supple.  Skin:    General: Skin is warm and dry.  Neurological:     General: No focal deficit present.     Mental Status: He is alert and oriented to person, place, and time.     Cranial Nerves: No cranial nerve deficit.  Coordination: Coordination normal.     ED Results / Procedures / Treatments   Labs (all labs ordered are listed, but only abnormal results are displayed) Labs Reviewed - No data to display  EKG EKG Interpretation  Date/Time:  Thursday March 28 2022 22:52:18 EST Ventricular Rate:  76 PR Interval:  196 QRS Duration: 110 QT Interval:  416 QTC Calculation: 468 R Axis:   73 Text Interpretation: Sinus rhythm No significant change since 02/09/2022 Confirmed by Veryl Speak [phone]) on 03/28/2022 11:03:27 PM  Radiology No results found.  Procedures Procedures    Medications Ordered in ED Medications  sodium chloride 0.9 % bolus 1,000 mL (has no administration in time range)    ED Course/ Medical Decision Making/ A&P  Patient is a 77 year old male presenting with complaints of weakness, nausea, and near syncope.  He has a  history of similar episodes as described in the HPI.  He arrives here with stable vital signs and is clinically well-appearing.  Physical examination reveals no obvious abnormalities.  Workup initiated including CBC, metabolic panel, TSH, and troponin.  All studies are essentially unremarkable, however initial troponin is elevated at 92, then upon repeat was 122.  Patient was hydrated with normal saline and observed in the ER with resolution of symptoms no additional symptoms.  He is not complaining of any chest discomfort.  He does have an elevation of troponin, however has always run a chronically elevated troponin and this is consistent with his baseline.  I have reviewed his prior heart cath and he has minimal atherosclerotic disease.  I highly doubt an acute cardiac event and believe patient can safely be discharged.  The cause of his symptoms is unknown.  It was felt to be related to sick sinus in the past, but still are occurring despite pacemaker placement.  At this point, all vital signs are stable, patient is asymptomatic, and workup is reassuring.  I feel as though he can safely be discharged with as needed return.  Final Clinical Impression(s) / ED Diagnoses Final diagnoses:  None    Rx / DC Orders ED Discharge Orders     None         Veryl Speak, MD 03/29/22 [phone]

## 2022-03-28 NOTE — ED Provider Notes (Incomplete)
  [facility] Provider Note   [name]: [id number] Arrival date & time: 03/28/22  2243     History  No chief complaint on file.   Theodore Hutchinson is a 77 y.o. male.  Patient is a 77 year old male  The history is provided by the patient.       Home Medications Prior to Admission medications   Medication Sig Start Date End Date Taking? Authorizing Provider  Calcium Carbonate Antacid (TUMS ULTRA 1000 PO) Take 2,000 mg by mouth at bedtime.    [provider]  ELDERBERRY PO Take 3,200 mg by mouth as needed.    [provider]  Omega-3 Fatty Acids (FISH OIL PO) Take 3 tablets by mouth at bedtime.    [provider]  rosuvastatin (CRESTOR) 5 MG tablet TAKE ONE TABLET BY MOUTH ONCE DAILY 03/26/22   Satira Sark, MD      Allergies    Patient has no known allergies.    Review of Systems   Review of Systems  Physical Exam Updated Vital Signs BP 134/79 (BP Location: Left Arm)   Pulse 73   Temp 97.6 F (36.4 C) (Oral)   Resp 19   Ht '5\' 10"'$  (1.778 m)   Wt 77.1 kg   SpO2 98%   BMI 24.39 kg/m  Physical Exam  ED Results / Procedures / Treatments   Labs (all labs ordered are listed, but only abnormal results are displayed) Labs Reviewed - No data to display  EKG EKG Interpretation  Date/Time:  Thursday March 28 2022 22:52:18 EST Ventricular Rate:  76 PR Interval:  196 QRS Duration: 110 QT Interval:  416 QTC Calculation: 468 R Axis:   73 Text Interpretation: Sinus rhythm No significant change since 02/09/2022 Confirmed by Veryl Speak [id number] on 03/28/2022 11:03:27 PM  Radiology No results found.  Procedures Procedures  {Document cardiac monitor, telemetry assessment procedure when appropriate:1}  Medications Ordered in ED Medications  sodium chloride 0.9 % bolus 1,000 mL (has no administration in time range)    ED Course/ Medical Decision Making/ A&P   {   Click here for ABCD2,  HEART and other calculatorsREFRESH Note before signing :1}                          Medical Decision Making Amount and/or Complexity of Data Reviewed Labs: ordered.   ***  {Document critical care time when appropriate:1} {Document review of labs and clinical decision tools ie heart score, Chads2Vasc2 etc:1}  {Document your independent review of radiology images, and any outside records:1} {Document your discussion with family members, caretakers, and with consultants:1} {Document social determinants of health affecting pt's care:1} {Document your decision making why or why not admission, treatments were needed:1} Final Clinical Impression(s) / ED Diagnoses Final diagnoses:  None    Rx / DC Orders ED Discharge Orders     None

## 2022-03-28 NOTE — ED Triage Notes (Addendum)
Pt to ed via [facility]. Pt c/o hypotension, n/v, chills,diaphoretic . Pt had a pacemaker placed this past December. Paced set at 59. Pt states had a similar episode like this back in December after having pacemaker placed  20G R AC 500 NS bolus

## 2022-03-29 ENCOUNTER — Telehealth: Payer: Self-pay | Admitting: Cardiovascular Disease

## 2022-03-29 DIAGNOSIS — R531 Weakness: Secondary | ICD-10-CM | POA: Diagnosis not present

## 2022-03-29 LAB — COMPREHENSIVE METABOLIC PANEL
ALT: 17 U/L (ref 0–44)
AST: 21 U/L (ref 15–41)
Albumin: 3.8 g/dL (ref 3.5–5.0)
Alkaline Phosphatase: 73 U/L (ref 38–126)
Anion gap: 9 (ref 5–15)
BUN: 17 mg/dL (ref 8–23)
CO2: 26 mmol/L (ref 22–32)
Calcium: 8.9 mg/dL (ref 8.9–10.3)
Chloride: 102 mmol/L (ref 98–111)
Creatinine, Ser: 1.13 mg/dL (ref 0.61–1.24)
GFR, Estimated: >60 mL/min (ref >60)
Glucose, Bld: 136 mg/dL — ABNORMAL HIGH (ref 70–99)
Potassium: 3.5 mmol/L (ref 3.5–5.1)
Sodium: 137 mmol/L (ref 135–145)
Total Bilirubin: 0.5 mg/dL (ref 0.3–1.2)
Total Protein: 7.2 g/dL (ref 6.5–8.1)

## 2022-03-29 LAB — CBC WITH DIFFERENTIAL/PLATELET
Abs Immature Granulocytes: 0.05 10*3/uL (ref 0.00–0.07)
Basophils Absolute: 0 10*3/uL (ref 0.0–0.1)
Basophils Relative: 0 %
Eosinophils Absolute: 0 10*3/uL (ref 0.0–0.5)
Eosinophils Relative: 0 %
HCT: 36.4 % — ABNORMAL LOW (ref 39.0–52.0)
Hemoglobin: 12.2 g/dL — ABNORMAL LOW (ref 13.0–17.0)
Immature Granulocytes: 0 %
Lymphocytes Relative: 6 %
Lymphs Abs: 0.9 10*3/uL (ref 0.7–4.0)
MCH: 31.4 pg (ref 26.0–34.0)
MCHC: 33.5 g/dL (ref 30.0–36.0)
MCV: 93.8 fL (ref 80.0–100.0)
Monocytes Absolute: 1 10*3/uL (ref 0.1–1.0)
Monocytes Relative: 7 %
Neutro Abs: 11.5 10*3/uL — ABNORMAL HIGH (ref 1.7–7.7)
Neutrophils Relative %: 87 %
Platelets: 156 10*3/uL (ref 150–400)
RBC: 3.88 MIL/uL — ABNORMAL LOW (ref 4.22–5.81)
RDW: 12.7 % (ref 11.5–15.5)
WBC: 13.4 10*3/uL — ABNORMAL HIGH (ref 4.0–10.5)
nRBC: 0 % (ref 0.0–0.2)

## 2022-03-29 LAB — TROPONIN I (HIGH SENSITIVITY)
Troponin I (High Sensitivity): 122 ng/L (ref <18)
Troponin I (High Sensitivity): 92 ng/L — ABNORMAL HIGH (ref <18)

## 2022-03-29 LAB — TSH: TSH: 5.043 u[IU]/mL — ABNORMAL HIGH (ref 0.350–4.500)

## 2022-03-29 LAB — LACTIC ACID, PLASMA: Lactic Acid, Venous: 1.2 mmol/L (ref 0.5–1.9)

## 2022-03-29 NOTE — Telephone Encounter (Signed)
Patient has [place] home monitor, unable to force transmission, patient was away from monitor until 3:00AM 03/29/22. Last update from home monitor 02/26/23 12:32AM , no anomalies, normal device function, no episodes reordered. Will need to check Monday 04/01/22 AM for any episodes.

## 2022-03-29 NOTE — Telephone Encounter (Signed)
Pt spouse states pt has had these episodes in the past where he gets hot, sweaty, and unaware of what's going on. She states he had one last night that was really bad. She states he was spitting up, not so much vomiting. She states he got really dizzy and not as responsive. She called paramedics and they took his bp which was around 80/42 hr 95 o2 stats dropped to 84%. Took him to [facility] ER last night. She states he is fine now but not sure what pt should do from here. Please advise.   She also asked if his device picked up something unusual, will route over to them as well.

## 2022-03-29 NOTE — ED Notes (Signed)
Lab at bedside drawing 2nd trop 

## 2022-03-29 NOTE — Discharge Instructions (Signed)
Continue medications as previously prescribed.  Return to the emergency department if you develop any new and/or concerning symptoms.

## 2022-03-29 NOTE — Telephone Encounter (Signed)
Spoke with patients wife informed her that the monitor will not send any data till tonight and we would give her a call Monday, she voiced understanding

## 2022-04-02 NOTE — Telephone Encounter (Signed)
Wife notified message sent to provider for review.

## 2022-04-02 NOTE — Telephone Encounter (Signed)
Reviewed transmission 04/02/22. No episodes on the device or arrhythmias noted.  Normal device function.    Spoke with wife, no episodes since. Appears event last week more related to hypotensive situation, no bradycardia, and should follow up with Dr. Domenic Polite. Will forward to their office to contact patient/wife with appt.

## 2022-04-05 ENCOUNTER — Encounter: Payer: Self-pay | Admitting: Nurse Practitioner

## 2022-04-05 ENCOUNTER — Ambulatory Visit: Payer: PPO | Attending: Nurse Practitioner | Admitting: Nurse Practitioner

## 2022-04-05 VITALS — BP 122/76 | HR 78 | Ht 70.0 in | Wt 171.2 lb

## 2022-04-05 DIAGNOSIS — I2510 Atherosclerotic heart disease of native coronary artery without angina pectoris: Secondary | ICD-10-CM | POA: Diagnosis not present

## 2022-04-05 DIAGNOSIS — Z950 Presence of cardiac pacemaker: Secondary | ICD-10-CM

## 2022-04-05 DIAGNOSIS — R1084 Generalized abdominal pain: Secondary | ICD-10-CM | POA: Diagnosis not present

## 2022-04-05 DIAGNOSIS — Z87898 Personal history of other specified conditions: Secondary | ICD-10-CM | POA: Diagnosis not present

## 2022-04-05 DIAGNOSIS — I495 Sick sinus syndrome: Secondary | ICD-10-CM | POA: Diagnosis not present

## 2022-04-05 DIAGNOSIS — R42 Dizziness and giddiness: Secondary | ICD-10-CM | POA: Diagnosis not present

## 2022-04-05 NOTE — Patient Instructions (Addendum)
Medication Instructions:  Your physician recommends that you continue on your current medications as directed. Please refer to the Current Medication list given to you today.  Labwork: none  Testing/Procedures: none  Follow-Up: Your physician recommends that you schedule a follow-up appointment in: as planned You have been referred to a Gastroenterologist  Any Other Special Instructions Will Be Listed Below (If Applicable). Increase your salt intake Stay well hydrated Your physician has requested that you regularly monitor and record your blood pressure readings at home. Please use the same machine at the same time of day to check your readings and record them to bring to your follow-up visit.  If you need a refill on your cardiac medications before your next appointment, please call your pharmacy.

## 2022-04-05 NOTE — Progress Notes (Unsigned)
Cardiology Office Note:    Date:  04/05/2022  ID:  Theodore Hutchinson, DOB May 10, 1945, MRN [id number]  PCP:  [name], MD   [facility] Cardiologist:  Rozann Lesches, MD Electrophysiologist:  Melida Quitter, MD     Referring MD: [name], MD   CC: Dizziness after meals  History of Present Illness:    Theodore Hutchinson is a 77 y.o. male with a hx of the following:  CAD History of syncope SSS, s/p PPM (followed by EP)  Patient is a very pleasant 77 year old male with past medical history as mentioned above.   Hospitalized in August 2023 for syncopal event,  etiology was unclear.  Was noted to have mild troponin elevations.  Cardiology was consulted and underwent cardiac catheterization that revealed mild nonobstructive CAD.  Echocardiogram was normal.  Cardiac monitor revealed heart rate ranging from 37-98 bpm and average heart rate 62.  Rare PACs and PVCs 1 brief run of SVT lasting 4 beats was noted, there was a 3-second pause during period of bradycardia that occurred, patient was asymptomatic.  First seen by Dr. Domenic Polite in October 2023 for history of syncope.  Was feeling well.  Patient reported another episode of syncope in July 2023 [place]  Patient states he will became dizzy and blacked out, no prodromal symptoms were experienced.  Patient was referred to EP for consideration of ILR.  Was evaluated by Dr. Sabra Heck the following month.  Underwent successful implantation of [employer] implantable loop recorder for history of syncope.  Presented to [facility] on February 06, 2022 for evaluation of another syncopal episode while sitting in recliner.  Initial and repeat troponin values elevated at 80, 134, and 106.  Chest x-ray negative.  EKG shows sinus bradycardia 55 bpm, no acute ST changes.  Was admitted at [facility] cardiology was consulted.  Cardiology team spoke with [facility] rep and confirmed as stated  that rhythm was normal sinus rhythm with rates in 60s to 70s.  Full review of loop recorder showed significant pauses greater than 5 seconds.  Was transferred to [facility] and underwent successful placement of Biotronik dual-chamber pacemaker on February 08, 2022.  He had an ED visit on March 28, 2022 for feeling "hot and flushed", nauseated, and sweaty and had 1 episode of vomiting.  Was found to have low blood pressure on arrival and EMS administered IV fluids.  Patient was asymptomatic on arrival to ED.  Workup was reassuring.  Was discharged home later in stable condition.  Today he presents for follow-up with his wife. His wife reports he had another episode on Wednesday, January 31 after eating a meal when he got diaphoretic, dizzy, had urinary continence, with eyes closed. Wife describes hypoxia when checking his vitals with O2 sat at 87%, and notes urinary incontinence with episodes, and does have prodromal symptoms prior to episodes. Patient does admit to recent abdominal pain. Denies any chest pain or shortness of breath.  Denies any chest pain, shortness of breath, palpitations, orthopnea, PND, swelling or significant weight changes, acute bleeding, or claudication. Wife is concerned and wants to know if this is postprandial hypotension. Denies any other questions or concerns.   Past Medical History:  Diagnosis Date   History of syncope    Mild CAD    Cardiac catheterization August 2023   Mixed hyperlipidemia    Osteoarthritis    SSS (sick sinus syndrome) ([place])  s/p PPM 02/2022    Past Surgical History:  Procedure Laterality Date   CERVICAL DISC SURGERY     x2   CERVICAL FUSION     Dr. Patrice Paradise 1'06 [facility]   HERNIA REPAIR Left    open LIH/mesh 7''16   LEFT HEART CATH AND CORONARY ANGIOGRAPHY N/A 10/05/2021   Procedure: LEFT HEART CATH AND CORONARY ANGIOGRAPHY;  Surgeon: Burnell Blanks, MD;  Location: [facility];  Service: Cardiovascular;  Laterality: N/A;   LOOP RECORDER  REMOVAL N/A 02/08/2022   Procedure: LOOP RECORDER REMOVAL;  Surgeon: Constance Haw, MD;  Location: [place] [facility];  Service: Cardiovascular;  Laterality: N/A;   PACEMAKER IMPLANT N/A 02/08/2022   Procedure: PACEMAKER IMPLANT;  Surgeon: Constance Haw, MD;  Location: [facility];  Service: Cardiovascular;  Laterality: N/A;   SHOULDER ARTHROSCOPY W/ ROTATOR CUFF REPAIR Right    stenosis intestines Right    age 24 "pyloric stenosis"   TOTAL HIP ARTHROPLASTY Left 03/14/2016   Procedure: LEFT TOTAL HIP ARTHROPLASTY ANTERIOR APPROACH;  Surgeon: Rod Can, MD;  Location: [facility];  Service: Orthopedics;  Laterality: Left;    Current Medications: Current Meds  Medication Sig   Calcium Carbonate Antacid (TUMS ULTRA 1000 PO) Take 2,000 mg by mouth at bedtime.   Omega-3 Fatty Acids (FISH OIL PO) Take 3 tablets by mouth at bedtime.   rosuvastatin (CRESTOR) 5 MG tablet TAKE ONE TABLET BY MOUTH ONCE DAILY     Allergies:   Patient has no known allergies.   Social History   Socioeconomic History   Marital status: Married    Spouse name: Not on file   Number of children: Not on file   Years of education: Not on file   Highest education level: Not on file  Occupational History   Not on file  Tobacco Use   Smoking status: Former    Packs/day: 1.00    Years: 30.00    Total pack years: 30.00    Types: Cigarettes    Quit date: 03/08/2007    Years since quitting: 15.0   Smokeless tobacco: Never  Substance and Sexual Activity   Alcohol use: Yes    Alcohol/week: 1.0 standard drink of alcohol    Types: 1 Cans of beer per week    Comment: rare social 2-3 per month   Drug use: No   Sexual activity: Not on file  Other Topics Concern   Not on file  Social History Narrative   Not on file   Social Determinants of Health   Financial Resource Strain: Not on file  Food Insecurity: No Food Insecurity (02/07/2022)   Hunger Vital Sign    Worried About Running Out of Food in the  Last Year: Never true    Ran Out of Food in the Last Year: Never true  Transportation Needs: No Transportation Needs (02/07/2022)   PRAPARE - Hydrologist (Medical): No    Lack of Transportation (Non-Medical): No  Physical Activity: Not on file  Stress: Not on file  Social Connections: Not on file     Family History: The patient's family history includes Alzheimer's disease in his father.  ROS:   Review of Systems  Constitutional: Negative.   HENT: Negative.    Eyes: Negative.   Respiratory: Negative.    Cardiovascular: Negative.   Gastrointestinal:  Positive for abdominal pain and nausea. Negative for blood in stool, constipation, diarrhea, heartburn, melena and vomiting.  See HPI.   Genitourinary:  Negative for dysuria, flank pain, frequency, hematuria and urgency.       See HPI.   Musculoskeletal: Negative.   Skin: Negative.   Neurological:  Positive for dizziness. Negative for tingling, tremors, sensory change, speech change, focal weakness, seizures, loss of consciousness, weakness and headaches.       See HPI.   Endo/Heme/Allergies: Negative.   Psychiatric/Behavioral: Negative.      Please see the history of present illness.    All other systems reviewed and are negative.  EKGs/Labs/Other Studies Reviewed:    The following studies were reviewed today:   EKG:  EKG is not ordered today.    Cardiac monitor on December 28, 2021: Patient had a min HR of 37 bpm, max HR of 133 bpm, and avg HR of 62 bpm. Predominant underlying rhythm was Sinus Rhythm. 1 run of Supraventricular Tachycardia occurred lasting 4 beats with a max rate of 133 bpm (avg 119 bpm). 1 Pause occurred lasting 3  secs (20 bpm). Isolated SVEs were rare (<1.0%), SVE Couplets were rare (<1.0%), and SVE Triplets were rare (<1.0%). Isolated VEs were rare (<1.0%), and no VE Couplets or VE Triplets were present.    Left heart cath on October 05, 2021:   Prox RCA lesion is 20%  stenosed.   Mid RCA lesion is 30% stenosed.   Mid LAD lesion is 30% stenosed.   Mid LAD to Dist LAD lesion is 30% stenosed.   Mild non-obstructive CAD Normal LV filling pressure   Recommendations: No further ischemic workup. Medical management of CAD.   Echocardiogram on October 05, 2021: 1. Left ventricular ejection fraction, by estimation, is 60 to 65%. The  left ventricle has normal function. The left ventricle has no regional  wall motion abnormalities. Left ventricular diastolic parameters are  consistent with Grade I diastolic  dysfunction (impaired relaxation).   2. Right ventricular systolic function is normal. The right ventricular  size is mildly enlarged. Tricuspid regurgitation signal is inadequate for  assessing PA pressure.   3. The mitral valve is normal in structure. No evidence of mitral valve  regurgitation. No evidence of mitral stenosis.   4. The aortic valve is abnormal. There is moderate calcification of the  aortic valve. Aortic valve regurgitation is not visualized. Aortic valve  sclerosis/calcification is present, without any evidence of aortic  stenosis.   5. The inferior vena cava is dilated in size with >50% respiratory  variability, suggesting right atrial pressure of 8 mmHg.  Recent Labs: 02/07/2022: Magnesium 2.1 03/29/2022: ALT 17; BUN 17; Creatinine, Ser 1.13; Hemoglobin 12.2; Platelets 156; Potassium 3.5; Sodium 137; TSH 5.043  Recent Lipid Panel    Component Value Date/Time   CHOL 125 02/07/2022 0921   TRIG 23 02/07/2022 0921   HDL 40 (L) 02/07/2022 0921   CHOLHDL 3.1 02/07/2022 0921   VLDL 5 02/07/2022 0921   LDLCALC 80 02/07/2022 0921    Physical Exam:    VS:  BP 122/76   Pulse 78   Ht '5\' 10"'$  (1.778 m)   Wt 171 lb 3.2 oz (77.7 kg)   SpO2 96%   BMI 24.56 kg/m     Wt Readings from Last 3 Encounters:  04/05/22 171 lb 3.2 oz (77.7 kg)  03/28/22 170 lb (77.1 kg)  02/09/22 173 lb 15.1 oz (78.9 kg)     GEN: Well nourished, well  developed in no acute distress HEENT: Normal NECK: No JVD; No carotid bruits CARDIAC: S1/S2, RRR,  no murmurs, rubs, gallops; 2+ pulses RESPIRATORY:  Clear to auscultation without rales, wheezing or rhonchi  MUSCULOSKELETAL:  No edema; No deformity  SKIN: Thin skin, warm and dry NEUROLOGIC:  Alert and oriented x 3 PSYCHIATRIC:  Normal affect   ASSESSMENT:    1. Coronary artery disease involving native heart without angina pectoris, unspecified vessel or lesion type   2. History of syncope   3. Dizziness of unknown etiology   4. SSS (sick sinus syndrome) ([place])   5. Pacemaker   6. Generalized abdominal pain    PLAN:    In order of problems listed above:  CAD Cardiac cath in 10/2021 revealed mild, non-obstructive CAD. Stable with no anginal symptoms. No indication for ischemic evaluation. Continue rosuvastatin. Heart healthy diet encouraged.   History of syncope, Dizziness after meals Hospitalized 10/2021 for syncope,  etiology unclear. Review of loop recorder showed significant pauses > 5 seconds. S/p Biotronik dual-chamber pacemaker 02/2022. Recent device readings revealed no episodes or arrhthymias noted with normal device function. Wife notes recent episodes after meals that sound GI/neurologic in nature (see HPI), wife is concerned about postprandial hypotension. Recommended Neuro referral as symptoms sounded seizure-like, however patient and wife decline. Will refer to GI - see below and patient is agreeable to undergo orthostatics at next OV. Recommended compression stockings, increase salt intake, drinking water prior to meals, and changing positions slowly, and checking BP before and after meals and to notify our office if there is a significant change. Heart healthy diet encouraged.   SSS, s/p PPM Recent device transmission 04/02/22 that was negative for any episodes or arrhythmias noted.  Normal device function was seen.  Currently not on any heart rate lowering medications.   Continue to follow-up with EP.  Heart healthy diet recommended.  Abdominal pain Recent episode of abdominal pain.  Patient says he has not seen GI specialist in a long time.  Patient and wife are agreeable to GI referral.  Will place referral today.  5. Disposition: Follow-up with Dr. Domenic Polite or APP as scheduled or sooner if anything changes.  Medication Adjustments/Labs and Tests Ordered: Current medicines are reviewed at length with the patient today.  Concerns regarding medicines are outlined above.  Orders Placed This Encounter  Procedures   Ambulatory referral to Gastroenterology   No orders of the defined types were placed in this encounter.   Patient Instructions  Medication Instructions:  Your physician recommends that you continue on your current medications as directed. Please refer to the Current Medication list given to you today.  Labwork: none  Testing/Procedures: none  Follow-Up: Your physician recommends that you schedule a follow-up appointment in: as planned You have been referred to a Gastroenterologist  Any Other Special Instructions Will Be Listed Below (If Applicable). Increase your salt intake Stay well hydrated Your physician has requested that you regularly monitor and record your blood pressure readings at home. Please use the same machine at the same time of day to check your readings and record them to bring to your follow-up visit.  If you need a refill on your cardiac medications before your next appointment, please call your pharmacy.   Signed, Finis Bud, NP  04/06/2022 9:04 PM    [place]

## 2022-04-06 ENCOUNTER — Encounter: Payer: Self-pay | Admitting: Nurse Practitioner

## 2022-04-18 ENCOUNTER — Ambulatory Visit: Payer: PPO | Admitting: Cardiovascular Disease

## 2022-04-25 ENCOUNTER — Encounter (INDEPENDENT_AMBULATORY_CARE_PROVIDER_SITE_OTHER): Payer: Self-pay | Admitting: Gastroenterology

## 2022-04-25 ENCOUNTER — Ambulatory Visit (INDEPENDENT_AMBULATORY_CARE_PROVIDER_SITE_OTHER): Payer: PPO | Admitting: Gastroenterology

## 2022-04-25 VITALS — BP 126/71 | HR 68 | Temp 98.0°F | Ht 70.0 in | Wt 171.3 lb

## 2022-04-25 DIAGNOSIS — R112 Nausea with vomiting, unspecified: Secondary | ICD-10-CM

## 2022-04-25 DIAGNOSIS — R55 Syncope and collapse: Secondary | ICD-10-CM

## 2022-04-25 NOTE — Patient Instructions (Signed)
We will send a referral to neurology for further evaluation Please let me know if you have not heard from them in the next 2 weeks If neuro evaluation is unremarkable, we can consider doing an upper endoscopy, though at this time it does not appear symptoms are primarily GI related  It was a pleasure to see you today. I want to create trusting relationships with patients and provide genuine, compassionate, and quality care. I truly value your feedback! please be on the lookout for a survey regarding your visit with me today. I appreciate your input about our visit and your time in completing this!    Shalin Linders L. Alver Sorrow, MSN, APRN, AGNP-C Adult-Gerontology Nurse Practitioner [facility] at [facility]

## 2022-04-25 NOTE — Progress Notes (Signed)
Referring Provider: Finis Bud, NP Primary Care Physician:  [name], MD Primary GI Physician: new   Chief Complaint  Patient presents with   Emesis    Patient referred for abdominal pain. He reports he is not having abdominal pain. He is having some gas. Reports he has a lot of vomiting. Started last July he gets hot and sweaty and has vomiting and feels like he is going to pass out.    HPI:   Theodore Hutchinson is a 77 y.o. male with past medical history of syncope, CAD s/p cardiac catheterization 10/2021, OA, SSS s/p PPM, HLD.   Patient presenting today for abdominal pain/emesis.   Per chart review, patient hospitalized aug 2023 for syncope, etiology unclear, mildly elevated trops, cardiac cath revleaed mild nonobstructive CAD, echo normal. Cardiac monitor revealed PVCs, brief run of SVT, 3 second pause/bradycardia. Presented to [facility] on February 06, 2022 for evaluation of another syncopal episode while sitting in recliner.  Trops again elevated.  EKG shows sinus bradycardia 55 bpm, no acute ST changes.  admitted at [facility] cardiology was consulted.  Cardiology team spoke with [facility] rep and confirmed as stated that rhythm was normal sinus rhythm with rates in 60s to 70s.  Full review of loop recorder showed significant pauses greater than 5 seconds.   transferred to [facility] and underwent successful placement of Biotronik dual-chamber pacemaker on February 08, 2022. ED visit on March 28, 2022 for feeling "hot and flushed", nauseated, and sweaty and had 1 episode of vomiting.  found to have low blood pressure on arrival.  Workup was reassuring.    Saw cardiology on 2/2 reporting some hypoxia during syncopal episode on jan 31st, urinary incontinence. Had recent abdominal pain as well.    Present:  Last syncopal episode was on 1/31. Notes he was sitting in his recliner. Episodes typically happen at night between 630-830, an hour to hour and a half after eating.  usually after  he has eaten a larger meal. Notes he get very sweaty and lightheaded. He can feel the episodes coming on, he will usually vomit thereafter. Wife reports he is in altered consciousness during these episodes, sometime almost appearing to have seizure like symptoms moving his head in an odd way though no convulsing. Episodes do not last long when they occur. He notes he feels better after vomiting. Wife notes that O2 sats will drop into the 80s when these events occur. has loss of urinary continence during these times. No bowel incontinence. Wife reports she has called EMS a few times for these episodes, blood pressure during one of these episodes was 80/40. He denies any abdominal pain, nausea, or vomiting except during these episodes. No weight loss. No changes in appetite. Cardiology suggested they take BP before and after eating, wife notes on the two occasions they have done this it was normal. No new medications since symptoms began. Denies rectal bleeding or melena.   Per wife they were told patient needed neurology referral previously, per Cardiology note they declined this referral, however they state they never declined and are agreeable to see neuro if felt necessary.  Wearing compression socks as they were trying to rule out any circulation may be contributing   Notes he has some gas on occasion but this has been a chronic thing for him.   NSAID use: none  Social hx: no etoh or tobacco  Fam hx: denies CRC or liver disease   Last Colonoscopy:around 10 years  ago, normal per patient  Last Endoscopy:never   Recommendations:    Past Medical History:  Diagnosis Date   History of syncope    Mild CAD    Cardiac catheterization August 2023   Mixed hyperlipidemia    Osteoarthritis    SSS (sick sinus syndrome) ([place])    s/p PPM 02/2022    Past Surgical History:  Procedure Laterality Date   CERVICAL DISC SURGERY     x2   CERVICAL FUSION     Dr. Patrice Paradise 1'06 [facility]   HERNIA REPAIR Left     open LIH/mesh 7''16   LEFT HEART CATH AND CORONARY ANGIOGRAPHY N/A 10/05/2021   Procedure: LEFT HEART CATH AND CORONARY ANGIOGRAPHY;  Surgeon: Burnell Blanks, MD;  Location: [place] [facility];  Service: Cardiovascular;  Laterality: N/A;   LOOP RECORDER REMOVAL N/A 02/08/2022   Procedure: LOOP RECORDER REMOVAL;  Surgeon: Constance Haw, MD;  Location: [place] [facility];  Service: Cardiovascular;  Laterality: N/A;   PACEMAKER IMPLANT N/A 02/08/2022   Procedure: PACEMAKER IMPLANT;  Surgeon: Constance Haw, MD;  Location: [place] [facility];  Service: Cardiovascular;  Laterality: N/A;   SHOULDER ARTHROSCOPY W/ ROTATOR CUFF REPAIR Right    stenosis intestines Right    age 77 "pyloric stenosis"   TOTAL HIP ARTHROPLASTY Left 03/14/2016   Procedure: LEFT TOTAL HIP ARTHROPLASTY ANTERIOR APPROACH;  Surgeon: Rod Can, MD;  Location: [facility];  Service: Orthopedics;  Laterality: Left;    Current Outpatient Medications  Medication Sig Dispense Refill   Calcium Carbonate Antacid (TUMS ULTRA 1000 PO) Take 2,000 mg by mouth at bedtime.     Omega-3 Fatty Acids (FISH OIL PO) Take 3 tablets by mouth at bedtime.     rosuvastatin (CRESTOR) 5 MG tablet TAKE ONE TABLET BY MOUTH ONCE DAILY 90 tablet 1   No current facility-administered medications for this visit.    Allergies as of 04/25/2022   (No Known Allergies)    Family History  Problem Relation Age of Onset   Alzheimer's disease Father     Social History   Socioeconomic History   Marital status: Married    Spouse name: Not on file   Number of children: Not on file   Years of education: Not on file   Highest education level: Not on file  Occupational History   Not on file  Tobacco Use   Smoking status: Former    Packs/day: 1.00    Years: 30.00    Total pack years: 30.00    Types: Cigarettes    Quit date: 03/08/2007    Years since quitting: 15.1    Passive exposure: Current   Smokeless tobacco: Never  Substance and  Sexual Activity   Alcohol use: Yes    Alcohol/week: 1.0 standard drink of alcohol    Types: 1 Cans of beer per week    Comment: rare social 2-3 per month   Drug use: No   Sexual activity: Not on file  Other Topics Concern   Not on file  Social History Narrative   Not on file   Social Determinants of Health   Financial Resource Strain: Not on file  Food Insecurity: No Food Insecurity (02/07/2022)   Hunger Vital Sign    Worried About Running Out of Food in the Last Year: Never true    Ran Out of Food in the Last Year: Never true  Transportation Needs: No Transportation Needs (02/07/2022)   PRAPARE - Transportation    Lack  of Transportation (Medical): No    Lack of Transportation (Non-Medical): No  Physical Activity: Not on file  Stress: Not on file  Social Connections: Not on file   Review of systems General: negative for malaise, night sweats, fever, chills, weight loss Neck: Negative for lumps, goiter, pain and significant neck swelling Resp: Negative for cough, wheezing, dyspnea at rest CV: Negative for chest pain, leg swelling, palpitations, orthopnea GI: denies melena, hematochezia, nausea, vomiting, diarrhea, constipation, dysphagia, odyonophagia, early satiety or unintentional weight loss.  MSK: Negative for joint pain or swelling, back pain, and muscle pain. Derm: Negative for itching or rash Psych: Denies depression, anxiety, memory loss, confusion. No homicidal or suicidal ideation.  Heme: Negative for prolonged bleeding, bruising easily, and swollen nodes. Endocrine: Negative for cold or heat intolerance, polyuria, polydipsia and goiter. Neuro: negative for tremor, gait imbalance, syncope and seizures. The remainder of the review of systems is noncontributory.  Physical Exam: BP 126/71 (BP Location: Right Arm, Patient Position: Sitting, Cuff Size: Large)   Pulse 68   Temp 98 F (36.7 C) (Oral)   Ht '5\' 10"'$  (1.778 m)   Wt 171 lb 4.8 oz (77.7 kg)   BMI 24.58 kg/m   General:   Alert and oriented. No distress noted. Pleasant and cooperative.  Head:  Normocephalic and atraumatic. Eyes:  Conjuctiva clear without scleral icterus. Mouth:  Oral mucosa pink and moist. Good dentition. No lesions. Heart: Normal rate and rhythm, s1 and s2 heart sounds present.  Lungs: Clear lung sounds in all lobes. Respirations equal and unlabored. Abdomen:  +BS, soft, non-tender and non-distended. No rebound or guarding. No HSM or masses noted. Derm: No palmar erythema or jaundice Msk:  Symmetrical without gross deformities. Normal posture. Extremities:  Without edema. Neurologic:  Alert and  oriented x4 Psych:  Alert and cooperative. Normal mood and affect.  Invalid input(s): "6 MONTHS"   ASSESSMENT: Theodore Hutchinson is a 77 y.o. male presenting today as a new patient.  Patient referred by cardiology after having continued syncopal episodes initially thought secondary to symptomatic bradycardia for which he had pacemaker implanted for. There have been no further episodes of bradycardia noted on his device, however, patient continues to have syncopal episodes noting feeling flushed, having nausea and vomiting when this occurs. He has no GI symptoms other than during these episodes. It is unclear at this time the etiology of his continued syncope, though given urinary incontinence, there is certainly concern for a neurological component. At this time I do not feel further GI evaluation is going to be revealing, I discussed with the patient and his wife that I recommend he be evaluated by neurology, to which they are amenable to. Will place referral for this. If neurological evaluation rules out neuro etiology and there is suspicion this is stemming from a GI process , we could consider performing an upper endoscopy for further evaluation.    PLAN:  Neuro referral  2. Consider EGD if neuro workup unremarkable   All questions were answered, patient verbalized understanding and is  in agreement with plan as outlined above.   TBD  Ina Poupard L. Jacobey Gura, MSN, APRN, AGNP-C Adult-Gerontology Nurse Practitioner [facility]  I have reviewed the note and agree with the APP's assessment as described in this progress note  Unclear etiology of symptoms, wonder if this could be related to possible dumping syndrome but this is not usually associated to seizures. Agree with neurology and cardiology clearance prior to scheduling EGD.  Maylon Peppers, MD Gastroenterology and Hepatology [facility]

## 2022-04-28 DIAGNOSIS — R112 Nausea with vomiting, unspecified: Secondary | ICD-10-CM | POA: Insufficient documentation

## 2022-04-29 NOTE — Addendum Note (Signed)
Addended by: Harvel Quale on: 04/29/2022 10:18 PM   Modules accepted: Level of Service

## 2022-05-17 ENCOUNTER — Encounter: Payer: PPO | Admitting: Cardiology

## 2022-05-23 ENCOUNTER — Ambulatory Visit: Payer: PPO | Attending: Cardiology | Admitting: Cardiovascular Disease

## 2022-05-23 ENCOUNTER — Encounter: Payer: Self-pay | Admitting: Cardiovascular Disease

## 2022-05-23 VITALS — BP 108/64 | HR 60 | Ht 70.0 in | Wt 169.8 lb

## 2022-05-23 DIAGNOSIS — Z950 Presence of cardiac pacemaker: Secondary | ICD-10-CM

## 2022-05-23 DIAGNOSIS — Z87898 Personal history of other specified conditions: Secondary | ICD-10-CM | POA: Diagnosis not present

## 2022-05-23 NOTE — Patient Instructions (Signed)
Medication Instructions:  Your physician recommends that you continue on your current medications as directed. Please refer to the Current Medication list given to you today.  *If you need a refill on your cardiac medications before your next appointment, please call your pharmacy*  Lab Work: None ordered  Testing/Procedures: None ordered  Follow-Up: At [facility], you and your health needs are our priority.  As part of our continuing mission to provide you with exceptional heart care, we have created designated Provider Care Teams.  These Care Teams include your primary Cardiologist (physician) and Advanced Practice Providers (APPs -  Physician Assistants and Nurse Practitioners) who all work together to provide you with the care you need, when you need it.  Your next appointment:   1 year(s)  The format for your next appointment:   In Person  Provider:   You may see Augustus E Mealor, MD or one of the following Advanced Practice Providers on your designated Care Team:   Renee Ursuy, PA-C Michael "Andy" Tillery, PA-C Suzann Riddle, NP{  

## 2022-05-23 NOTE — Progress Notes (Signed)
Electrophysiology Office Note:    Date:  05/23/2022   ID:  Theodore Hutchinson, DOB Sep 25, 1945, MRN [id number]  PCP:  [name], MD   [facility] Providers Cardiologist:  Rozann Lesches, MD Electrophysiologist:  Melida Quitter, MD     Referring MD: [name], MD   Chief complaint: passing out  History of Present Illness:    Theodore Hutchinson is a 77 y.o. male with a hx of mild CAD and recurrent syncope.  This is my first time seeing him. He has a number of ER visits and clinic visits in our records which I have reviewed.   Briefly, the patient has had multiple syncopal episodes. These have occurred in a variety of settings - one in a store, another at a restaurant, one occurred while he was sitting.  A loop recorder was placed for further monitoring.  Shortly thereafter, he presented any pain hospital with recurrent syncope.  Interrogation of the device eventually revealed sinus bradycardia leading to recurrent 5-second pauses with junctional escape beats.  A pacemaker was implanted by Dr. Curt Bears.  Unfortunately, he has continued to have syncopal episodes.  These are preceded by feeling hot and flushed.  He presented to the ER on January 25 with syncope preceded by flushing, diaphoresis, nausea.  He was noted to have low blood pressure started on IV fluids.  He again had another episode on January 31 with similar prodrome followed by loss of consciousness and urinary incontinence.  Today, he has no complaints.  He has not had any episodes of syncope or presyncope since January.  At that time his lower rate was increased, and his CLS set to a more aggressive setting.  He is been drinking more water and started using compression hose.  Past Medical History:  Diagnosis Date   History of syncope    Mild CAD    Cardiac catheterization August 2023   Mixed hyperlipidemia    Osteoarthritis    SSS (sick sinus syndrome) ([place])    s/p PPM 02/2022    Past Surgical  History:  Procedure Laterality Date   CERVICAL DISC SURGERY     x2   CERVICAL FUSION     Dr. Patrice Paradise 1'06 [facility]   HERNIA REPAIR Left    open LIH/mesh 7''16   LEFT HEART CATH AND CORONARY ANGIOGRAPHY N/A 10/05/2021   Procedure: LEFT HEART CATH AND CORONARY ANGIOGRAPHY;  Surgeon: Burnell Blanks, MD;  Location: [place] [facility];  Service: Cardiovascular;  Laterality: N/A;   LOOP RECORDER REMOVAL N/A 02/08/2022   Procedure: LOOP RECORDER REMOVAL;  Surgeon: Constance Haw, MD;  Location: [place] [facility];  Service: Cardiovascular;  Laterality: N/A;   PACEMAKER IMPLANT N/A 02/08/2022   Procedure: PACEMAKER IMPLANT;  Surgeon: Constance Haw, MD;  Location: [place] [facility];  Service: Cardiovascular;  Laterality: N/A;   SHOULDER ARTHROSCOPY W/ ROTATOR CUFF REPAIR Right    stenosis intestines Right    age 61 "pyloric stenosis"   TOTAL HIP ARTHROPLASTY Left 03/14/2016   Procedure: LEFT TOTAL HIP ARTHROPLASTY ANTERIOR APPROACH;  Surgeon: Rod Can, MD;  Location: [facility];  Service: Orthopedics;  Laterality: Left;    Current Medications: No outpatient medications have been marked as taking for the 05/23/22 encounter (Appointment) with Yesika Rispoli, Yetta Barre, MD.     Allergies:   Patient has no known allergies.   Social History   Socioeconomic History   Marital status: Married    Spouse name: Not on  file   Number of children: Not on file   Years of education: Not on file   Highest education level: Not on file  Occupational History   Not on file  Tobacco Use   Smoking status: Former    Packs/day: 1.00    Years: 30.00    Additional pack years: 0.00    Total pack years: 30.00    Types: Cigarettes    Quit date: 03/08/2007    Years since quitting: 15.2    Passive exposure: Current   Smokeless tobacco: Never  Substance and Sexual Activity   Alcohol use: Yes    Alcohol/week: 1.0 standard drink of alcohol    Types: 1 Cans of beer per week    Comment: rare social 2-3 per  month   Drug use: No   Sexual activity: Not on file  Other Topics Concern   Not on file  Social History Narrative   Not on file   Social Determinants of Health   Financial Resource Strain: Not on file  Food Insecurity: No Food Insecurity (02/07/2022)   Hunger Vital Sign    Worried About Running Out of Food in the Last Year: Never true    Ran Out of Food in the Last Year: Never true  Transportation Needs: No Transportation Needs (02/07/2022)   PRAPARE - Hydrologist (Medical): No    Lack of Transportation (Non-Medical): No  Physical Activity: Not on file  Stress: Not on file  Social Connections: Not on file     Family History: The patient's family history includes Alzheimer's disease in his father.  ROS:   Please see the history of present illness.    All other systems reviewed and are negative.  EKGs/Labs/Other Studies Reviewed Today:    Cardiac monitor Patient had a min HR of 37 bpm, max HR of 133 bpm, and avg HR of 62 bpm. Predominant underlying rhythm was Sinus Rhythm. 1 run of Supraventricular Tachycardia occurred lasting 4 beats with a max rate of 133 bpm (avg 119 bpm). 1 Pause occurred lasting 3  secs (20 bpm). Isolated SVEs were rare (<1.0%), SVE Couplets were rare (<1.0%), and SVE Triplets were rare (<1.0%). Isolated VEs were rare (<1.0%), and no VE Couplets or VE Triplets were present.   EKG:  Last EKG results: today - A-paced 60 bpm   Recent Labs: 02/07/2022: Magnesium 2.1 03/29/2022: ALT 17; BUN 17; Creatinine, Ser 1.13; Hemoglobin 12.2; Platelets 156; Potassium 3.5; Sodium 137; TSH 5.043     Physical Exam:    VS:  There were no vitals taken for this visit.    Wt Readings from Last 3 Encounters:  04/25/22 171 lb 4.8 oz (77.7 kg)  04/05/22 171 lb 3.2 oz (77.7 kg)  03/28/22 170 lb (77.1 kg)     GEN: Well nourished, well developed in no acute distress CARDIAC: RRR, no murmurs, rubs, gallops RESPIRATORY:  Normal work of  breathing MUSCULOSKELETAL: no edema    ASSESSMENT & PLAN:    Recurrent syncope:  - Neurocardiogenic. The pacemaker has addressed the cardioinhibitory aspect (pauses and bradycardia), but it cannot treat the vasodepressor response (drop in blood pressure due to vasodilation).  He is doing better now with compression hose, increase fluid uptake, and more aggressive CLS settings for his Biotronik pacemaker.  Biotronic pacemaker: Normal function      Follow-up 1 year with APP.    Medication Adjustments/Labs and Tests Ordered: Current medicines are reviewed at length with the patient today.  Concerns regarding medicines are outlined above.  No orders of the defined types were placed in this encounter.  No orders of the defined types were placed in this encounter.    Signed, Melida Quitter, MD  05/23/2022 1:56 PM    [place]

## 2022-05-30 ENCOUNTER — Ambulatory Visit (INDEPENDENT_AMBULATORY_CARE_PROVIDER_SITE_OTHER): Payer: PPO | Admitting: Neurology

## 2022-05-30 ENCOUNTER — Encounter: Payer: Self-pay | Admitting: Neurology

## 2022-05-30 VITALS — BP 115/79 | HR 71 | Ht 70.0 in | Wt 168.5 lb

## 2022-05-30 DIAGNOSIS — R55 Syncope and collapse: Secondary | ICD-10-CM | POA: Diagnosis not present

## 2022-05-30 NOTE — Progress Notes (Signed)
[facility]  PATIENT: Theodore Hutchinson DOB: [birth year]  REQUESTING CLINICIAN: Gabriel Rung, NP HISTORY FROM: Patient and spouse  REASON FOR VISIT: Recurrent syncope    HISTORICAL  CHIEF COMPLAINT:  Chief Complaint  Patient presents with   New Patient (Initial Visit)    Rm 14: wife present Syncope:nausea and vomiting but haven't had one since January 31st but gets feeling hot. Has had a total of 7 episodes since July 6th 2023. Wife stated that it was almost seizure like.     HISTORY OF PRESENT ILLNESS:  This 77 year old gentleman with a history of hyperlipidemia who is presenting with recurrent syncope.  History obtained from patient and wife.  Since July 2023 patient had a total of 7 syncopal episodes.  At first he was seen by cardiology and had a loop recorder.  They found he was bradycardic and had pauses up to 5 seconds.  He did have a pacemaker placed initially set up to 50.  He continued to have syncope then follow-up with cardiology and had a pacemaker reset to 60.  Since having the pacemaker setting was increased to 60 he had 3 additional syncopal episodes.  The last one being on January 31.  At that time he was noted to have a to be hypotensive to [id number] systolic. He reports that usually, these events happen in the evening between 630 PM and 830 PM after having a large meal.  He was sitting in his recliner and feeling hot, diaphoretic, he will ask for cold rag to put on his forehead.  During the event he reports that can hear his wife but wife stated that he is not responsive.  Wife reports that he gets rigid, not responsive, followed by nausea and vomiting. There are no post ictal confusion, there are no convulsion noted.  He did however have urinary incontinence with some of the events.   After his last episode in January, he saw cardiology and they recommended compressive stockings, increasing fluid and having smaller meals and since then he has not had any  additional events.  He denies any previous history of seizure, denies any seizure risk factors.  Currently he reported he feels much better.    OTHER MEDICAL CONDITIONS: Recurrent syncope, Hyperlipidemia, CAD s/p pacemaker    REVIEW OF SYSTEMS: Full 14 system review of systems performed and negative with exception of: As noted in the HPI   ALLERGIES: No Known Allergies  HOME MEDICATIONS: Outpatient Medications Prior to Visit  Medication Sig Dispense Refill   Calcium Carbonate Antacid (TUMS ULTRA 1000 PO) Take 2,000 mg by mouth at bedtime.     Omega-3 Fatty Acids (FISH OIL PO) Take 3 tablets by mouth at bedtime.     rosuvastatin (CRESTOR) 5 MG tablet TAKE ONE TABLET BY MOUTH ONCE DAILY 90 tablet 1   No facility-administered medications prior to visit.    PAST MEDICAL HISTORY: Past Medical History:  Diagnosis Date   History of syncope    Mild CAD    Cardiac catheterization August 2023   Mixed hyperlipidemia    Osteoarthritis    SSS (sick sinus syndrome) ([place])    s/p PPM 02/2022   Syncope and collapse     PAST SURGICAL HISTORY: Past Surgical History:  Procedure Laterality Date   CERVICAL DISC SURGERY     x2   CERVICAL FUSION     Dr. Patrice Paradise 1'06 [facility]   HERNIA REPAIR Left    open LIH/mesh 7''16   LEFT HEART  CATH AND CORONARY ANGIOGRAPHY N/A 10/05/2021   Procedure: LEFT HEART CATH AND CORONARY ANGIOGRAPHY;  Surgeon: Burnell Blanks, MD;  Location: [facility];  Service: Cardiovascular;  Laterality: N/A;   LOOP RECORDER REMOVAL N/A 02/08/2022   Procedure: LOOP RECORDER REMOVAL;  Surgeon: Constance Haw, MD;  Location: [facility];  Service: Cardiovascular;  Laterality: N/A;   PACEMAKER IMPLANT N/A 02/08/2022   Procedure: PACEMAKER IMPLANT;  Surgeon: Constance Haw, MD;  Location: [facility];  Service: Cardiovascular;  Laterality: N/A;   SHOULDER ARTHROSCOPY W/ ROTATOR CUFF REPAIR Right    stenosis intestines Right    age 20 "pyloric  stenosis"   TOTAL HIP ARTHROPLASTY Left 03/14/2016   Procedure: LEFT TOTAL HIP ARTHROPLASTY ANTERIOR APPROACH;  Surgeon: Rod Can, MD;  Location: [facility];  Service: Orthopedics;  Laterality: Left;    FAMILY HISTORY: Family History  Problem Relation Age of Onset   Dementia Mother    Hypercholesterolemia Mother    Alzheimer's disease Father     SOCIAL HISTORY: Social History   Socioeconomic History   Marital status: Married    Spouse name: Not on file   Number of children: Not on file   Years of education: Not on file   Highest education level: Not on file  Occupational History   Not on file  Tobacco Use   Smoking status: Former    Packs/day: 1.00    Years: 30.00    Additional pack years: 0.00    Total pack years: 30.00    Types: Cigarettes    Quit date: 03/08/2007    Years since quitting: 15.2    Passive exposure: Current   Smokeless tobacco: Never  Substance and Sexual Activity   Alcohol use: Yes    Alcohol/week: 1.0 standard drink of alcohol    Types: 1 Cans of beer per week    Comment: rare social 2-3 per month   Drug use: No   Sexual activity: Not on file  Other Topics Concern   Not on file  Social History Narrative   Not on file   Social Determinants of Health   Financial Resource Strain: Not on file  Food Insecurity: No Food Insecurity (02/07/2022)   Hunger Vital Sign    Worried About Running Out of Food in the Last Year: Never true    Ran Out of Food in the Last Year: Never true  Transportation Needs: No Transportation Needs (02/07/2022)   PRAPARE - Hydrologist (Medical): No    Lack of Transportation (Non-Medical): No  Physical Activity: Not on file  Stress: Not on file  Social Connections: Not on file  Intimate Partner Violence: Not At Risk (02/07/2022)   Humiliation, Afraid, Rape, and Kick questionnaire    Fear of Current or Ex-Partner: No    Emotionally Abused: No    Physically Abused: No    Sexually Abused: No     PHYSICAL EXAM  GENERAL EXAM/CONSTITUTIONAL: Vitals:  Vitals:   05/30/22 0808  BP: 115/79  Pulse: 71  Weight: 168 lb 8 oz (76.4 kg)  Height: 5\' 10"  (1.778 m)   Body mass index is 24.18 kg/m. Wt Readings from Last 3 Encounters:  05/30/22 168 lb 8 oz (76.4 kg)  05/23/22 169 lb 12.8 oz (77 kg)  04/25/22 171 lb 4.8 oz (77.7 kg)   Patient is in no distress; well developed, nourished and groomed; neck is supple  EYES: Visual fields full to confrontation, Extraocular  movements intacts,   MUSCULOSKELETAL: Gait, strength, tone, movements noted in Neurologic exam below  NEUROLOGIC: MENTAL STATUS:      No data to display         awake, alert, oriented to person, place and time recent and remote memory intact normal attention and concentration language fluent, comprehension intact, naming intact fund of knowledge appropriate  CRANIAL NERVE:  2nd, 3rd, 4th, 6th - Visual fields full to confrontation, extraocular muscles intact, no nystagmus 5th - facial sensation symmetric 7th - facial strength symmetric 8th - hearing intact 9th - palate elevates symmetrically, uvula midline 11th - shoulder shrug symmetric 12th - tongue protrusion midline  MOTOR:  normal bulk and tone, full strength in the BUE, BLE  SENSORY:  normal and symmetric to light touch  COORDINATION:  finger-nose-finger, fine finger movements normal  REFLEXES:  deep tendon reflexes present and symmetric  GAIT/STATION:  normal   DIAGNOSTIC DATA (LABS, IMAGING, TESTING) - I reviewed patient records, labs, notes, testing and imaging myself where available.  Lab Results  Component Value Date   WBC 13.4 (H) 03/29/2022   HGB 12.2 (L) 03/29/2022   HCT 36.4 (L) 03/29/2022   MCV 93.8 03/29/2022   PLT 156 03/29/2022      Component Value Date/Time   NA 137 03/29/2022 0008   K 3.5 03/29/2022 0008   CL 102 03/29/2022 0008   CO2 26 03/29/2022 0008   GLUCOSE 136 (H) 03/29/2022 0008   BUN 17  03/29/2022 0008   CREATININE 1.13 03/29/2022 0008   CALCIUM 8.9 03/29/2022 0008   PROT 7.2 03/29/2022 0008   ALBUMIN 3.8 03/29/2022 0008   AST 21 03/29/2022 0008   ALT 17 03/29/2022 0008   ALKPHOS 73 03/29/2022 0008   BILITOT 0.5 03/29/2022 0008   GFRNONAA >60 03/29/2022 0008   GFRAA >60 03/15/2016 0420   Lab Results  Component Value Date   CHOL 125 02/07/2022   HDL 40 (L) 02/07/2022   LDLCALC 80 02/07/2022   TRIG 23 02/07/2022   CHOLHDL 3.1 02/07/2022   Lab Results  Component Value Date   HGBA1C 5.4 10/05/2021   Lab Results  Component Value Date   VITAMINB12 1,210 (H) 10/05/2021   Lab Results  Component Value Date   TSH 5.043 (H) 03/29/2022    CT Head 10/04/2021 1. Mild left parietooccipital scalp soft tissue swelling, without evidence of an acute fracture or acute intracranial abnormality. 2. Generalized cerebral atrophy with chronic white matter small vessel ischemic changes.   ASSESSMENT AND PLAN  77 y.o. year old male with hyperlipidemia, who is presenting with recurrent syncope.  Found to have sinus pauses, symptomatic bradycardia and hypotension, syncope likely cardiogenic. Due to reports of urinary incontinence associated with the syncopal episode, will obtain EEG to rule out seizures, I will contact patient to go over the result.  If you continue to have syncopal event, will obtain a 3-day ambulatory EEG.  Both following patient and wife voiced understanding and are comfortable with plans.  Follow-up with your PCP and cardiologist and return as needed     1. Syncope, unspecified syncope type      Patient Instructions  Continue currents medication Routine EEG Increase physical/strength exercise Follow-up with your PCP and cardiologist Return as needed or if worse.  Orders Placed This Encounter  Procedures   EEG adult    No orders of the defined types were placed in this encounter.   Return if symptoms worsen or fail to improve.    Zadin Lange  April Manson, MD 05/30/2022, 9:18 AM  [facility] [address], [place], [place] [address] [phone]

## 2022-05-30 NOTE — Patient Instructions (Addendum)
Continue currents medication Routine EEG Increase physical/strength exercise Follow-up with your PCP and cardiologist Return as needed or if worse.

## 2022-06-12 ENCOUNTER — Ambulatory Visit (INDEPENDENT_AMBULATORY_CARE_PROVIDER_SITE_OTHER): Payer: PPO | Admitting: Neurology

## 2022-06-12 DIAGNOSIS — R55 Syncope and collapse: Secondary | ICD-10-CM | POA: Diagnosis not present

## 2022-06-12 NOTE — Procedures (Signed)
    History:  77 year old man with   EEG classification: Awake and drowsy  Description of the recording: The background rhythms of this recording consists of a fairly well modulated medium amplitude alpha rhythm of 8 Hz that is reactive to eye opening and closure. Present in the anterior head region is a 15-20 Hz beta activity. Photic stimulation was performed, did not show any abnormalities. Hyperventilation was not performed. Drowsiness was manifested by background fragmentation. No abnormal epileptiform discharges seen during this recording. There was no focal slowing. There were no electrographic seizure identified.   Abnormality: None   Impression: This is a normal EEG recorded while drowsy and awake. No evidence of interictal epileptiform discharges. Normal EEGs, however, do not rule out epilepsy.    Theodore Norfolk, MD [facility]

## 2022-07-02 ENCOUNTER — Telehealth: Payer: Self-pay | Admitting: Cardiology

## 2022-07-02 MED ORDER — ROSUVASTATIN CALCIUM 5 MG PO TABS: 5.0000 mg | ORAL_TABLET | Freq: Every day | ORAL | 1 refills | Status: DC

## 2022-07-02 NOTE — Telephone Encounter (Signed)
*  STAT* If patient is at the pharmacy, call can be transferred to refill team.   1. Which medications need to be refilled? (please list name of each medication and dose if known)   rosuvastatin (CRESTOR) 5 MG table    2. Which pharmacy/location (including street and city if local pharmacy) is medication to be sent to?  [facility]  3. Do they need a 30 day or 90 day supply?   30 day  Pt has apt on 07/26/22 w/ E. Philis Nettle

## 2022-07-19 ENCOUNTER — Ambulatory Visit: Payer: PPO | Admitting: Cardiology

## 2022-07-26 ENCOUNTER — Encounter: Payer: Self-pay | Admitting: Nurse Practitioner

## 2022-07-26 ENCOUNTER — Ambulatory Visit: Payer: PPO | Attending: Cardiology | Admitting: Nurse Practitioner

## 2022-07-26 VITALS — BP 130/70 | HR 60 | Ht 70.0 in | Wt 170.4 lb

## 2022-07-26 DIAGNOSIS — I2510 Atherosclerotic heart disease of native coronary artery without angina pectoris: Secondary | ICD-10-CM | POA: Diagnosis not present

## 2022-07-26 DIAGNOSIS — Z950 Presence of cardiac pacemaker: Secondary | ICD-10-CM | POA: Diagnosis not present

## 2022-07-26 DIAGNOSIS — I495 Sick sinus syndrome: Secondary | ICD-10-CM

## 2022-07-26 DIAGNOSIS — Z87898 Personal history of other specified conditions: Secondary | ICD-10-CM

## 2022-07-26 NOTE — Progress Notes (Signed)
Cardiology Office Note:    Date: 07/26/2022  ID:  BOBAK PYNES, DOB 12-Jan-1946, MRN [id number]  PCP:  Toma Deiters, MD   [facility] Providers Cardiologist:  Nona Dell, MD Electrophysiologist:  Maurice Small, MD      Referring MD: Toma Deiters, MD   CC: Here for follow-up  History of Present Illness:    Theodore Hutchinson is a 77 y.o. male with a hx of the following:  CAD History of syncope SSS, s/p PPM (followed by EP)  Patient is a very pleasant 77 year old male with past medical history as mentioned above.   Hospitalized in August 2023 for syncopal event,  etiology was unclear.  Was noted to have mild troponin elevations.  Cardiology was consulted and underwent cardiac catheterization that revealed mild nonobstructive CAD.  Echocardiogram was normal.  Cardiac monitor revealed heart rate ranging from 37-98 bpm and average heart rate 62.  Rare PACs and PVCs 1 brief run of SVT lasting 4 beats was noted, there was a 3-second pause during period of bradycardia that occurred, patient was asymptomatic.  First seen by Dr. Diona Browner in October 2023 for history of syncope.  Was feeling well.  Patient reported another episode of syncope in July 2023 [place]  Patient states he will became dizzy and blacked out, no prodromal symptoms were experienced.  Patient was referred to EP for consideration of ILR.  Was evaluated by Dr. Nelly Laurence the following month.  Underwent successful implantation of [employer] implantable loop recorder for history of syncope.  Presented to [facility] on February 06, 2022 for evaluation of another syncopal episode while sitting in recliner.  Initial and repeat troponin values elevated at 80, 134, and 106.  Chest x-ray negative.  EKG shows sinus bradycardia 55 bpm, no acute ST changes.  Was admitted at [facility] cardiology was consulted.  Cardiology team spoke with [facility] rep and confirmed as stated  that rhythm was normal sinus rhythm with rates in 60s to 70s.  Full review of loop recorder showed significant pauses greater than 5 seconds.  Was transferred to [facility] and underwent successful placement of Biotronik dual-chamber pacemaker on February 08, 2022.  He had an ED visit on March 28, 2022 for feeling "hot and flushed", nauseated, and sweaty and had 1 episode of vomiting.  Was found to have low blood pressure on arrival and EMS administered IV fluids.  Patient was asymptomatic on arrival to ED.  Workup was reassuring.  Was discharged home later in stable condition.  I last saw him for follow-up with his wife on April 05, 2022. His wife reported he had another episode on Wednesday, January 31 after eating a meal when he got diaphoretic, dizzy, had urinary continence, with eyes closed. Wife described hypoxia when checking his vitals with O2 sat at 87%, and noted urinary incontinence with episodes. Patient did admit to recent abdominal pain. Denied any chest pain or shortness of breath, or other associated symptoms.    In the interim, he has seen Dr. Nelly Laurence in March 2024.  Was doing well at that time.    Today he presents for follow-up.  He has not had any more episodes since I last saw him. Denies any chest pain, shortness of breath, palpitations, syncope, presyncope, dizziness, orthopnea, PND, swelling or significant weight changes, acute bleeding, or claudication.  Tolerating his medications well.   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 Noel Gerold 1'06 [facility]   HERNIA REPAIR Left    open LIH/mesh 7''16   LEFT HEART CATH AND CORONARY ANGIOGRAPHY N/A 10/05/2021   Procedure: LEFT HEART CATH  AND CORONARY ANGIOGRAPHY;  Surgeon: Kathleene Hazel, MD;  Location: [facility];  Service: Cardiovascular;  Laterality: N/A;   LOOP RECORDER REMOVAL N/A 02/08/2022   Procedure: LOOP RECORDER REMOVAL;  Surgeon: Regan Lemming, MD;  Location: [facility];  Service: Cardiovascular;  Laterality: N/A;   PACEMAKER IMPLANT N/A 02/08/2022   Procedure: PACEMAKER IMPLANT;  Surgeon: Regan Lemming, MD;  Location: [facility];  Service: Cardiovascular;  Laterality: N/A;   SHOULDER ARTHROSCOPY W/ ROTATOR CUFF REPAIR Right    stenosis intestines Right    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   Omega-3 Fatty Acids (FISH OIL PO) Take 3 tablets by mouth at bedtime.   rosuvastatin (CRESTOR) 5 MG tablet Take 1 tablet (5 mg total) by mouth daily.     Allergies:   Patient has no known allergies.   Social History   Socioeconomic History   Marital status: Married    Spouse name: Not on file   Number of children: Not on file   Years of education: Not on file   Highest education level: Not on file  Occupational History   Not on file  Tobacco Use   Smoking status: Former    Packs/day: 1.00    Years: 30.00    Additional pack years: 0.00    Total pack years: 30.00    Types: Cigarettes    Quit date: 03/08/2007    Years since quitting: 15.3    Passive exposure: Current   Smokeless tobacco: Never  Substance and Sexual Activity   Alcohol use: Yes    Alcohol/week: 1.0 standard drink of alcohol    Types: 1 Cans of beer per week    Comment: rare social 2-3 per month   Drug use: No   Sexual activity: Not on file  Other Topics Concern   Not on file  Social  History Narrative   Not on file   Social Determinants of Health   Financial Resource Strain: Not on file  Food Insecurity: No Food Insecurity (02/07/2022)   Hunger Vital Sign    Worried About Running Out of Food in the Last Year: Never true    Ran Out of Food in the Last Year: Never true  Transportation Needs: No Transportation Needs (02/07/2022)   PRAPARE - Administrator, Civil Service (Medical): No    Lack of Transportation (Non-Medical): No  Physical Activity: Not on file  Stress: Not on file  Social Connections: Not on file     Family History: The patient's family history includes Alzheimer's disease in  his father; Dementia in his mother; Hypercholesterolemia in his mother.  ROS:     Please see the history of present illness.    All other systems reviewed and are negative.  EKGs/Labs/Other Studies Reviewed:    The following studies were reviewed today:   EKG:  EKG is not ordered today.    Cardiac monitor on December 28, 2021: Patient had a min HR of 37 bpm, max HR of 133 bpm, and avg HR of 62 bpm. Predominant underlying rhythm was Sinus Rhythm. 1 run of Supraventricular Tachycardia occurred lasting 4 beats with a max rate of 133 bpm (avg 119 bpm). 1 Pause occurred lasting 3  secs (20 bpm). Isolated SVEs were rare (<1.0%), SVE Couplets were rare (<1.0%), and SVE Triplets were rare (<1.0%). Isolated VEs were rare (<1.0%), and no VE Couplets or VE Triplets were present.    Left heart cath on October 05, 2021:   Prox RCA lesion is 20% stenosed.   Mid RCA lesion is 30% stenosed.   Mid LAD lesion is 30% stenosed.   Mid LAD to Dist LAD lesion is 30% stenosed.   Mild non-obstructive CAD Normal LV filling pressure   Recommendations: No further ischemic workup. Medical management of CAD.   Echocardiogram on October 05, 2021: 1. Left ventricular ejection fraction, by estimation, is 60 to 65%. The  left ventricle has normal function. The left ventricle has no  regional  wall motion abnormalities. Left ventricular diastolic parameters are  consistent with Grade I diastolic  dysfunction (impaired relaxation).   2. Right ventricular systolic function is normal. The right ventricular  size is mildly enlarged. Tricuspid regurgitation signal is inadequate for  assessing PA pressure.   3. The mitral valve is normal in structure. No evidence of mitral valve  regurgitation. No evidence of mitral stenosis.   4. The aortic valve is abnormal. There is moderate calcification of the  aortic valve. Aortic valve regurgitation is not visualized. Aortic valve  sclerosis/calcification is present, without any evidence of aortic  stenosis.   5. The inferior vena cava is dilated in size with >50% respiratory  variability, suggesting right atrial pressure of 8 mmHg.  Recent Labs: 02/07/2022: Magnesium 2.1 03/29/2022: ALT 17; BUN 17; Creatinine, Ser 1.13; Hemoglobin 12.2; Platelets 156; Potassium 3.5; Sodium 137; TSH 5.043  Recent Lipid Panel    Component Value Date/Time   CHOL 125 02/07/2022 0921   TRIG 23 02/07/2022 0921   HDL 40 (L) 02/07/2022 0921   CHOLHDL 3.1 02/07/2022 0921   VLDL 5 02/07/2022 0921   LDLCALC 80 02/07/2022 0921    Physical Exam:    VS:  BP 130/70   Pulse 60   Ht 5\' 10"  (1.778 m)   Wt 170 lb 6.4 oz (77.3 kg)   SpO2 97%   BMI 24.45 kg/m     Wt Readings from Last 3 Encounters:  07/26/22 170 lb 6.4 oz (77.3 kg)  05/30/22 168 lb 8 oz (76.4 kg)  05/23/22 169 lb 12.8 oz (77 kg)     GEN: Well nourished, well developed in no acute distress HEENT: Normal NECK: No JVD; No carotid bruits CARDIAC: S1/S2, RRR, no murmurs, rubs, gallops; 2+ pulses RESPIRATORY:  Clear to auscultation without rales, wheezing or rhonchi  MUSCULOSKELETAL:  No edema; No deformity  SKIN: Thin skin, warm and dry NEUROLOGIC:  Alert and oriented x 3 PSYCHIATRIC:  Normal affect   ASSESSMENT:    1. Coronary artery disease involving native heart without angina  pectoris, unspecified  vessel or lesion type   2. History of syncope   3. SSS (sick sinus syndrome) ([facility])   4. S/P placement of cardiac pacemaker     PLAN:    In order of problems listed above:  CAD Cardiac cath in 10/2021 revealed mild, non-obstructive CAD. Stable with no anginal symptoms. No indication for ischemic evaluation. Continue rosuvastatin. Heart healthy diet encouraged.   History of syncope Hospitalized 10/2021 for syncope,  etiology unclear. Review of loop recorder showed significant pauses > 5 seconds. S/p Biotronik dual-chamber pacemaker 02/2022 (see below). Past device readings revealed no episodes or arrhthymias noted with normal device function.  Denies any recurrent symptoms.    3. SSS, s/p PPM Recent device transmission 04/02/22 that was negative for any episodes or arrhythmias noted.  Normal device function was seen.  Currently not on any heart rate lowering medications.  Continue to follow-up with EP.  Heart healthy diet recommended.  4. Disposition: Follow-up with Dr. Diona Browner or APP in 6 months or sooner if anything changes.  Medication Adjustments/Labs and Tests Ordered: Current medicines are reviewed at length with the patient today.  Concerns regarding medicines are outlined above.  No orders of the defined types were placed in this encounter.  No orders of the defined types were placed in this encounter.   Patient Instructions  Medication Instructions:  Your physician recommends that you continue on your current medications as directed. Please refer to the Current Medication list given to you today.  Labwork: none  Testing/Procedures: none  Follow-Up: Your physician recommends that you schedule a follow-up appointment in: 6 months with Diona Browner  Any Other Special Instructions Will Be Listed Below (If Applicable).  If you need a refill on your cardiac medications before your next appointment, please call your pharmacy.   Signed, Sharlene Dory, NP   07/26/2022 2:44 PM     [facility]

## 2022-07-26 NOTE — Patient Instructions (Addendum)
Medication Instructions:  Your physician recommends that you continue on your current medications as directed. Please refer to the Current Medication list given to you today.  Labwork: none  Testing/Procedures: none  Follow-Up: Your physician recommends that you schedule a follow-up appointment in: 6 months with Diona Browner  Any Other Special Instructions Will Be Listed Below (If Applicable).  If you need a refill on your cardiac medications before your next appointment, please call your pharmacy.

## 2022-07-31 DIAGNOSIS — L570 Actinic keratosis: Secondary | ICD-10-CM | POA: Diagnosis not present

## 2022-12-22 ENCOUNTER — Other Ambulatory Visit: Payer: Self-pay | Admitting: Cardiology

## 2022-12-31 DIAGNOSIS — E785 Hyperlipidemia, unspecified: Secondary | ICD-10-CM | POA: Diagnosis not present

## 2022-12-31 DIAGNOSIS — I495 Sick sinus syndrome: Secondary | ICD-10-CM | POA: Diagnosis not present

## 2022-12-31 DIAGNOSIS — Z950 Presence of cardiac pacemaker: Secondary | ICD-10-CM | POA: Diagnosis not present

## 2023-02-18 DIAGNOSIS — L570 Actinic keratosis: Secondary | ICD-10-CM | POA: Diagnosis not present

## 2023-02-19 ENCOUNTER — Ambulatory Visit: Payer: PPO | Attending: Cardiology | Admitting: Cardiology

## 2023-02-19 ENCOUNTER — Encounter: Payer: Self-pay | Admitting: Cardiology

## 2023-02-19 ENCOUNTER — Telehealth: Payer: Self-pay

## 2023-02-19 VITALS — BP 132/80 | HR 73 | Ht 70.0 in | Wt 167.8 lb

## 2023-02-19 DIAGNOSIS — I2510 Atherosclerotic heart disease of native coronary artery without angina pectoris: Secondary | ICD-10-CM

## 2023-02-19 DIAGNOSIS — E782 Mixed hyperlipidemia: Secondary | ICD-10-CM | POA: Diagnosis not present

## 2023-02-19 DIAGNOSIS — E785 Hyperlipidemia, unspecified: Secondary | ICD-10-CM | POA: Diagnosis not present

## 2023-02-19 DIAGNOSIS — Z87898 Personal history of other specified conditions: Secondary | ICD-10-CM

## 2023-02-19 NOTE — Addendum Note (Signed)
Addended by: Carmelina Paddock on: 02/19/2023 08:37 AM   Modules accepted: Orders

## 2023-02-19 NOTE — Telephone Encounter (Signed)
Biotronik full report retrieved from website.  Printed report and will scan to Pt's media for review.  Remote checks rescheduled.

## 2023-02-19 NOTE — Patient Instructions (Signed)
Medication Instructions:  Your physician recommends that you continue on your current medications as directed. Please refer to the Current Medication list given to you today.   Labwork: Fasting Lipid Panel to be completed at [facility]  Testing/Procedures: None  Follow-Up: Your physician recommends that you schedule a follow-up appointment in: 6 months  Any Other Special Instructions Will Be Listed Below (If Applicable).  Thank you for choosing [facility]!      If you need a refill on your cardiac medications before your next appointment, please call your pharmacy.

## 2023-02-19 NOTE — Progress Notes (Signed)
    Cardiology Office Note  Date: 02/19/2023   ID: Theodore Hutchinson, DOB Jan 29, 1946, MRN [id number]  History of Present Illness: Theodore Hutchinson is a 77 y.o. male last seen in May by Ms. Philis Nettle NP, I reviewed the note.  He is here today with his wife for a follow-up visit.  He states that he has been doing very well, no syncope whatsoever over the last year.  No exertional chest pain, no dyspnea beyond NYHA class I.  He stays active with ADLs, walks his dog 2-3 times a day.  Biotronik pacemaker in place with followed by Dr. Nelly Laurence.  I reviewed his ECG from March.  Reviewed his medications which include Crestor and omega-3 supplements.  He states that he ran out of Crestor a few weeks ago.  No recent lipid profile per PCP.  Physical Exam: VS:  BP 132/80   Pulse 73   Ht 5\' 10"  (1.778 m)   Wt 167 lb 12.8 oz (76.1 kg)   SpO2 96%   BMI 24.08 kg/m , BMI Body mass index is 24.08 kg/m.  Wt Readings from Last 3 Encounters:  02/19/23 167 lb 12.8 oz (76.1 kg)  07/26/22 170 lb 6.4 oz (77.3 kg)  05/30/22 168 lb 8 oz (76.4 kg)    General: Patient appears comfortable at rest. HEENT: Conjunctiva and lids normal. Neck: Supple, no elevated JVP or carotid bruits. Lungs: Clear to auscultation, nonlabored breathing at rest. Cardiac: Regular rate and rhythm, no S3 or significant systolic murmur, no pericardial rub. Extremities: No pitting edema.  ECG:  An ECG dated 05/23/2022 was personally reviewed today and demonstrated:  Atrial paced rhythm.  Labwork: 03/29/2022: ALT 17; AST 21; BUN 17; Creatinine, Ser 1.13; Hemoglobin 12.2; Platelets 156; Potassium 3.5; Sodium 137; TSH 5.043     Component Value Date/Time   CHOL 125 02/07/2022 0921   TRIG 23 02/07/2022 0921   HDL 40 (L) 02/07/2022 0921   CHOLHDL 3.1 02/07/2022 0921   VLDL 5 02/07/2022 0921   LDLCALC 80 02/07/2022 0921   Other Studies Reviewed Today:  No interval cardiac testing for review today.  Assessment and Plan:  1.  History of  recurrent syncope with associated etiologies including symptomatic bradycardia/pauses and neurocardiogenic component as well.  He has a Biotronik pacemaker in place with follow-up by Dr. Nelly Laurence.  Symptomatically quite stable over the last year.  Continue to follow with the EP.  2.  CAD, mild nonobstructive stenoses documented at cardiac catheterization in August 2023.  LVEF 60 to 65%.  He is asymptomatic.  3.  Mixed hyperlipidemia.  Check FLP and refill Crestor, dose depending on lab results.  Disposition:  Follow up  6 months.  Signed, Jonelle Sidle, M.D., F.A.C.C. [facility] at [facility]

## 2023-03-12 MED ORDER — ROSUVASTATIN CALCIUM 10 MG PO TABS: 10.0000 mg | ORAL_TABLET | Freq: Every day | ORAL | 3 refills | Status: DC

## 2023-03-12 NOTE — Telephone Encounter (Signed)
-----   Message from Nona Dell sent at 03/11/2023  2:22 PM EST ----- Results reviewed.  Follow-up lipid panel shows cholesterol 182, LDL 120.  Suggest resuming Crestor at 10 mg daily.

## 2023-03-12 NOTE — Telephone Encounter (Signed)
 Patient informed. Copy sent to PCP

## 2023-04-25 ENCOUNTER — Telehealth: Payer: Self-pay | Admitting: Cardiovascular Disease

## 2023-04-25 NOTE — Telephone Encounter (Signed)
Patient's wife called in regarding 02/24/23 missed transmission. She would like to know if a transmission has been received since then. Please advise.

## 2023-05-01 NOTE — Telephone Encounter (Signed)
 I spoke with the pt wife to let her know his monitor did updated yesterday.

## 2023-05-18 NOTE — Progress Notes (Unsigned)
 Cardiology Office Note:  .   Date:  05/18/2023  ID:  Theodore Hutchinson, DOB 11/13/1945, MRN [id number] PCP: Toma Deiters, MD  [facility] Providers Cardiologist:  Nona Dell, MD Electrophysiologist:  Maurice Small, MD {  History of Present Illness: .   Theodore Hutchinson is a 78 y.o. 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  Interrogation of the device eventually revealed sinus bradycardia leading to recurrent 5-second pauses with junctional escape beats.   >>> A pacemaker was implanted (and loop removed) by Dr. Elberta Fortis.   Unfortunately, he continued to have syncopal episodes.   These are preceded by feeling hot and flushed.   Once he presented to the ER on March 28, 2022 with syncope preceded by flushing, diaphoresis, nausea.  He was noted to have low blood pressure started on IV fluids.   He again had another episode on January 31 with similar prodrome followed by loss of consciousness and urinary incontinence.   Saw Dr. Nelly Laurence 05/23/22, discussed unfortunately despite PPM, had continued to have syncope, once in the ER associated with low BP > IVF, and at a prior visithis CLS set to a more aggressive setting Since then he had been doing better with hydration and using compression hose, with no further syncope Discussed PPM limitation in treating his neurocardiogenic syncope (BP component) Doing better with compression and hydration  Most recently saw Dr. Diona Browner, 02/19/23, walking the dogs, doing well, no syncope No changes were made, maintained on his Crestor for mild CAD/HLD  Today's visit is scheduled as an annual device visit  ROS:   He is accompanied by hsi wife He reports doing very well and denies symptoms His wife reports about 3 mo ago a near syncopal event, got weak, very hot, nauseous  and then as starting to feel better threw up and then well again.  He has not had syncope.  He denies CP, SOB, palpitations  Device information Biotronik dual chamber PPM implanted 02/08/22  Studies Reviewed: Marland Kitchen    EKG done today and reviewed by myself:  SR 66bpm  DEVICE interrogation done today and reviewed by myself Battery and lead measurements are good No arrhythmias  No remotes His transmitter appears to be fully charged an [employer] rep came in and was able to reach out to advanced technical support and push a transmission through    10/05/21: TTE 1. Left ventricular ejection fraction, by estimation, is 60 to 65%. The  left ventricle has normal function. The left ventricle has no regional  wall motion abnormalities. Left ventricular diastolic parameters are  consistent with Grade I diastolic  dysfunction (impaired relaxation).   2. Right ventricular systolic function is normal. The right ventricular  size is mildly enlarged. Tricuspid regurgitation signal is inadequate for  assessing PA pressure.   3. The mitral valve is normal in structure. No evidence of mitral valve  regurgitation. No evidence of mitral stenosis.   4. The aortic valve is abnormal. There is moderate calcification of the  aortic valve. Aortic valve regurgitation is not visualized. Aortic valve  sclerosis/calcification is present, without any evidence of aortic  stenosis.   5. The inferior vena cava is dilated in size with >50% respiratory  variability, suggesting right atrial pressure of 8 mmHg.    Risk Assessment/Calculations:    Physical Exam:   VS:  There were no vitals taken for this visit.   Wt Readings from Last 3 Encounters:  02/19/23 167 lb 12.8 oz (76.1 kg)  07/26/22 170 lb 6.4 oz (77.3 kg)  05/30/22 168 lb 8 oz (76.4 kg)    GEN: Well nourished, well developed in no acute distress NECK: No JVD; No carotid bruits CARDIAC: RRR, no murmurs, rubs, gallops RESPIRATORY:  CTA b/l without  rales, wheezing or rhonchi  ABDOMEN: Soft, non-tender, non-distended EXTREMITIES: No edema; No deformity   PPM site: is stable, no thinning, fluctuation, tethering  ASSESSMENT AND PLAN: .    PPM intact function no programming changes made  Neurocardiogenic syncope Discussed mechanism with them Thankfully only the one episode since last seen and no full syncope Advised adequate hydration and avoiding any triggers if able to identify them     Dispo: remotes as usual, back in clinic in a year again with EP, sooner if needed  Signed, Sheilah Pigeon, PA-C

## 2023-05-20 ENCOUNTER — Ambulatory Visit: Payer: PPO | Attending: Physician Assistant | Admitting: Physician Assistant

## 2023-05-20 ENCOUNTER — Encounter: Payer: PPO | Admitting: Cardiovascular Disease

## 2023-05-20 VITALS — BP 136/64 | HR 66 | Ht 70.0 in | Wt 165.0 lb

## 2023-05-20 DIAGNOSIS — R55 Syncope and collapse: Secondary | ICD-10-CM

## 2023-05-20 DIAGNOSIS — Z950 Presence of cardiac pacemaker: Secondary | ICD-10-CM | POA: Diagnosis not present

## 2023-05-20 LAB — CUP PACEART INCLINIC DEVICE CHECK
Date Time Interrogation Session: 20250318180147
Implantable Lead Connection Status: 753985
Implantable Lead Connection Status: 753985
Implantable Lead Implant Date: 20231208
Implantable Lead Implant Date: 20231208
Implantable Lead Location: 753859
Implantable Lead Location: 753860
Implantable Lead Model: 377
Implantable Lead Model: 377
Implantable Lead Serial Number: 8001170061
Implantable Lead Serial Number: 8001193938
Implantable Pulse Generator Implant Date: 20231208
Lead Channel Pacing Threshold Amplitude: 0.8 V
Lead Channel Pacing Threshold Amplitude: 0.9 V
Lead Channel Pacing Threshold Pulse Width: 0.4 ms
Lead Channel Pacing Threshold Pulse Width: 0.4 ms
Lead Channel Sensing Intrinsic Amplitude: 4.2 mV
Lead Channel Sensing Intrinsic Amplitude: 8 mV
Pulse Gen Model: 407145
Pulse Gen Serial Number: 1000112021

## 2023-05-20 NOTE — Patient Instructions (Signed)
Medication Instructions:   Your physician recommends that you continue on your current medications as directed. Please refer to the Current Medication list given to you today.   *If you need a refill on your cardiac medications before your next appointment, please call your pharmacy*   Lab Work: NONE ORDERED  TODAY   If you have labs (blood work) drawn today and your tests are completely normal, you will receive your results only by: MyChart Message (if you have MyChart) OR A paper copy in the mail If you have any lab test that is abnormal or we need to change your treatment, we will call you to review the results.   Testing/Procedures: NONE ORDERED  TODAY     Follow-Up: At [facility], you and your health needs are our priority.  As part of our continuing mission to provide you with exceptional heart care, we have created designated Provider Care Teams.  These Care Teams include your primary Cardiologist (physician) and Advanced Practice Providers (APPs -  Physician Assistants and Nurse Practitioners) who all work together to provide you with the care you need, when you need it.  We recommend signing up for the patient portal called "MyChart".  Sign up information is provided on this After Visit Summary.  MyChart is used to connect with patients for Virtual Visits (Telemedicine).  Patients are able to view lab/test results, encounter notes, upcoming appointments, etc.  Non-urgent messages can be sent to your provider as well.   To learn more about what you can do with MyChart, go to [email or web address].    Your next appointment:   1 year(s)  Provider:   You may see Maurice Small, MD or one of the following Advanced Practice Providers on your designated Care Team:   Francis Dowse, [place]   Other Instructions

## 2023-05-28 ENCOUNTER — Encounter: Payer: Self-pay | Admitting: Cardiovascular Disease

## 2023-08-20 ENCOUNTER — Ambulatory Visit (INDEPENDENT_AMBULATORY_CARE_PROVIDER_SITE_OTHER): Payer: PPO

## 2023-08-20 DIAGNOSIS — I495 Sick sinus syndrome: Secondary | ICD-10-CM

## 2023-08-20 DIAGNOSIS — L570 Actinic keratosis: Secondary | ICD-10-CM | POA: Diagnosis not present

## 2023-08-21 ENCOUNTER — Ambulatory Visit: Payer: Self-pay | Admitting: Cardiovascular Disease

## 2023-08-21 LAB — CUP PACEART REMOTE DEVICE CHECK
Date Time Interrogation Session: 20250618093742
Implantable Lead Connection Status: 753985
Implantable Lead Connection Status: 753985
Implantable Lead Implant Date: 20231208
Implantable Lead Implant Date: 20231208
Implantable Lead Location: 753859
Implantable Lead Location: 753860
Implantable Lead Model: 377
Implantable Lead Model: 377
Implantable Lead Serial Number: 8001170061
Implantable Lead Serial Number: 8001193938
Implantable Pulse Generator Implant Date: 20231208
Pulse Gen Model: 407145
Pulse Gen Serial Number: 1000112021

## 2023-09-15 DIAGNOSIS — I10 Essential (primary) hypertension: Secondary | ICD-10-CM | POA: Diagnosis not present

## 2023-09-15 DIAGNOSIS — Z6823 Body mass index (BMI) 23.0-23.9, adult: Secondary | ICD-10-CM | POA: Diagnosis not present

## 2023-09-15 DIAGNOSIS — E782 Mixed hyperlipidemia: Secondary | ICD-10-CM | POA: Diagnosis not present

## 2023-09-15 DIAGNOSIS — R5383 Other fatigue: Secondary | ICD-10-CM | POA: Diagnosis not present

## 2023-09-15 DIAGNOSIS — R001 Bradycardia, unspecified: Secondary | ICD-10-CM | POA: Diagnosis not present

## 2023-09-15 DIAGNOSIS — I700 Atherosclerosis of aorta: Secondary | ICD-10-CM | POA: Diagnosis not present

## 2023-09-15 DIAGNOSIS — Z125 Encounter for screening for malignant neoplasm of prostate: Secondary | ICD-10-CM | POA: Diagnosis not present

## 2023-09-15 DIAGNOSIS — Z0000 Encounter for general adult medical examination without abnormal findings: Secondary | ICD-10-CM | POA: Diagnosis not present

## 2023-09-17 ENCOUNTER — Encounter (INDEPENDENT_AMBULATORY_CARE_PROVIDER_SITE_OTHER): Payer: Self-pay | Admitting: *Deleted

## 2023-09-22 ENCOUNTER — Encounter: Payer: Self-pay | Admitting: Cardiology

## 2023-09-22 ENCOUNTER — Ambulatory Visit: Attending: Cardiology | Admitting: Cardiology

## 2023-09-22 VITALS — BP 120/62 | HR 74 | Ht 70.0 in | Wt 163.8 lb

## 2023-09-22 DIAGNOSIS — R55 Syncope and collapse: Secondary | ICD-10-CM

## 2023-09-22 DIAGNOSIS — I2510 Atherosclerotic heart disease of native coronary artery without angina pectoris: Secondary | ICD-10-CM

## 2023-09-22 DIAGNOSIS — E782 Mixed hyperlipidemia: Secondary | ICD-10-CM

## 2023-09-22 DIAGNOSIS — Z950 Presence of cardiac pacemaker: Secondary | ICD-10-CM | POA: Diagnosis not present

## 2023-09-22 DIAGNOSIS — I495 Sick sinus syndrome: Secondary | ICD-10-CM | POA: Diagnosis not present

## 2023-09-22 NOTE — Patient Instructions (Signed)

## 2023-09-22 NOTE — Progress Notes (Signed)
    Cardiology Office Note  Date: 09/22/2023   ID: Theodore Hutchinson, DOB 07/02/1945, MRN [id number]  History of Present Illness: Theodore Hutchinson is a 78 y.o. male last seen in December 2024.  He is here for a routine visit.  States that he has been doing very well, no dizziness or syncope, no exertional chest pain or change in stamina.  Biotronik pacemaker in place with follow-up by Dr. Nancey.  Device interrogation in June revealed normal function.  We went over his medications.  He does not report any intolerances.  I reviewed his recent lab work, LDL 66.  Physical Exam: VS:  BP 120/62   Pulse 74   Ht 5' 10 (1.778 m)   Wt 163 lb 12.8 oz (74.3 kg)   SpO2 96%   BMI 23.50 kg/m , BMI Body mass index is 23.5 kg/m.  Wt Readings from Last 3 Encounters:  09/22/23 163 lb 12.8 oz (74.3 kg)  05/20/23 165 lb (74.8 kg)  02/19/23 167 lb 12.8 oz (76.1 kg)    General: Patient appears comfortable at rest. HEENT: Conjunctiva and lids normal. Neck: Supple, no elevated JVP or carotid bruits. Lungs: Clear to auscultation, nonlabored breathing at rest. Cardiac: Regular rate and rhythm, no S3 or significant systolic murmur. Extremities: No pitting edema.  ECG:  An ECG dated 05/20/2023 was personally reviewed today and demonstrated:  Sinus rhythm.  Labwork:  July 2025: Hemoglobin 12.6, platelets 186, BUN 15, creatinine 1.08, GFR 70, potassium 4.2, AST 20, ALT 16, cholesterol 121, triglycerides 60, HDL 42, LDL 66, TSH 1.9  Other Studies Reviewed Today:  No interval cardiac testing for review today.  Assessment and Plan:  1.  History of recurrent syncope with associated etiologies including symptomatic bradycardia/pauses and neurocardiogenic component as well.  He has a Biotronik pacemaker in place with follow-up by Dr. Nancey.  Doing very well with no recurring symptoms.  Continue observation.   2.  CAD, mild nonobstructive stenoses documented at cardiac catheterization in August 2023.  LVEF  60 to 65%.  He does not describe any angina or change in stamina.  I reviewed his interval ECG from March.  Continue Crestor  10 mg daily.   3.  Mixed hyperlipidemia.  LDL 66 in July.  Continue Crestor  10 mg daily.  Disposition:  Follow up 1 year.  Signed, Jayson JUDITHANN Sierras, M.D., F.A.C.C. [facility] at [facility]

## 2023-09-30 ENCOUNTER — Telehealth (INDEPENDENT_AMBULATORY_CARE_PROVIDER_SITE_OTHER): Payer: Self-pay | Admitting: Gastroenterology

## 2023-09-30 ENCOUNTER — Telehealth: Payer: Self-pay

## 2023-09-30 NOTE — Telephone Encounter (Signed)
 Who is your primary care physician: Dr.Xaje Hasanaji  Reasons for the colonoscopy: screening  Have you had a colonoscopy before?  no  Do you have family history of colon cancer? no  Previous colonoscopy with polyps removed? no  Do you have a history colorectal cancer?   no  Are you diabetic? If yes, Type 1 or Type 2?    no  Do you have a prosthetic or mechanical heart valve? no  Do you have a pacemaker/defibrillator?   yes  Have you had endocarditis/atrial fibrillation? no  Have you had joint replacement within the last 12 months?  no  Do you tend to be constipated or have to use laxatives? no  Do you have any history of drugs or alchohol?  no  Do you use supplemental oxygen?  no  Have you had a stroke or heart attack within the last 6 months? no  Do you take weight loss medication?  no     Do you take any blood-thinning medications such as: (aspirin , warfarin, Plavix, Aggrenox)  no  If yes we need the name, milligram, dosage and who is prescribing doctor  Current Outpatient Medications on File Prior to Visit  Medication Sig Dispense Refill   rosuvastatin  (CRESTOR ) 10 MG tablet Take 1 tablet (10 mg total) by mouth daily. 90 tablet 3   No current facility-administered medications on file prior to visit.    No Known Allergies   Pharmacy: [facility]  Primary Insurance Name: [employer] [id number], [facility] [id number]  Best number where you can be reached: [phone]

## 2023-09-30 NOTE — Telephone Encounter (Signed)
 LMTRC

## 2023-09-30 NOTE — Telephone Encounter (Signed)
 Pt returning call regarding questionnaire. [phone]

## 2023-10-01 ENCOUNTER — Encounter (INDEPENDENT_AMBULATORY_CARE_PROVIDER_SITE_OTHER): Payer: Self-pay | Admitting: *Deleted

## 2023-10-01 ENCOUNTER — Encounter: Payer: Self-pay | Admitting: *Deleted

## 2023-10-01 ENCOUNTER — Other Ambulatory Visit: Payer: Self-pay | Admitting: *Deleted

## 2023-10-01 MED ORDER — PEG 3350-KCL-NA BICARB-NACL 420 G PO SOLR: 4000.0000 mL | Freq: Once | ORAL | 0 refills | Status: AC

## 2023-10-01 NOTE — Telephone Encounter (Signed)
 Referral completed, TCS apt letter sent to PCP

## 2023-10-01 NOTE — Telephone Encounter (Signed)
See triage telephone encounter.

## 2023-10-01 NOTE — Telephone Encounter (Signed)
 Pt has been scheduled for 10/23/23. Instructions mailed and prep sent to pharmacy.

## 2023-10-20 ENCOUNTER — Encounter: Payer: Self-pay | Admitting: Cardiovascular Disease

## 2023-10-20 ENCOUNTER — Encounter (HOSPITAL_COMMUNITY)
Admission: RE | Admit: 2023-10-20 | Discharge: 2023-10-20 | Disposition: A | Discharge status: Home / Self Care | Source: Ambulatory Visit | Attending: Gastroenterology | Admitting: Gastroenterology

## 2023-10-20 ENCOUNTER — Other Ambulatory Visit: Payer: Self-pay

## 2023-10-20 ENCOUNTER — Encounter (HOSPITAL_COMMUNITY): Payer: Self-pay

## 2023-10-20 NOTE — Progress Notes (Signed)
 PERIOPERATIVE PRESCRIPTION FOR IMPLANTED CARDIAC DEVICE PROGRAMMING  Patient Information: Name:  Theodore Hutchinson  DOB:  05-12-1945  MRN:  [id number]    Planned Procedure: TCS Surgeon: Dr. Deatrice Dine Date of Procedure: 10/23/23 Cautery will be used. Position during surgery: left lateral decubitus  Device Information:  Clinic EP Physician:  Eulas Furbish, MD  Device Type:  Pacemaker Manufacturer and Phone #:  Biotronik: [phone] Pacemaker Dependent?:  No. Date of Last Device Check:  08/20/23 Normal Device Function?:  Yes.    Electrophysiologist's Recommendations:  Have magnet available. Provide continuous ECG monitoring when magnet is used or reprogramming is to be performed.  Procedure may interfere with device function.  Magnet should be placed over device during procedure.  Per Device Clinic Standing Orders, [place], [place]  12:21 PM 10/20/2023

## 2023-10-20 NOTE — Pre-Procedure Instructions (Signed)
 Heather from the device clinic called and left a message on C[facility], OR schedulers VM concerning position of patient during TCS. I attempted to call her back and it rang 7-10 times and there was no answer and no VM.

## 2023-10-23 ENCOUNTER — Encounter (HOSPITAL_COMMUNITY)
Admission: RE | Disposition: A | Discharge status: Home / Self Care | Payer: Self-pay | Source: Home / Self Care | Attending: Gastroenterology

## 2023-10-23 ENCOUNTER — Ambulatory Visit (HOSPITAL_COMMUNITY)
Admission: RE | Admit: 2023-10-23 | Discharge: 2023-10-23 | Disposition: A | Discharge status: Home / Self Care | Attending: Gastroenterology | Admitting: Gastroenterology

## 2023-10-23 ENCOUNTER — Ambulatory Visit (HOSPITAL_COMMUNITY): Admitting: Certified Registered"

## 2023-10-23 ENCOUNTER — Other Ambulatory Visit: Payer: Self-pay

## 2023-10-23 ENCOUNTER — Encounter (HOSPITAL_COMMUNITY): Payer: Self-pay | Admitting: Gastroenterology

## 2023-10-23 DIAGNOSIS — K648 Other hemorrhoids: Secondary | ICD-10-CM | POA: Diagnosis not present

## 2023-10-23 DIAGNOSIS — D122 Benign neoplasm of ascending colon: Secondary | ICD-10-CM | POA: Insufficient documentation

## 2023-10-23 DIAGNOSIS — Z1211 Encounter for screening for malignant neoplasm of colon: Secondary | ICD-10-CM

## 2023-10-23 DIAGNOSIS — K5730 Diverticulosis of large intestine without perforation or abscess without bleeding: Secondary | ICD-10-CM | POA: Diagnosis not present

## 2023-10-23 DIAGNOSIS — I2510 Atherosclerotic heart disease of native coronary artery without angina pectoris: Secondary | ICD-10-CM

## 2023-10-23 DIAGNOSIS — Z87891 Personal history of nicotine dependence: Secondary | ICD-10-CM | POA: Insufficient documentation

## 2023-10-23 DIAGNOSIS — I10 Essential (primary) hypertension: Secondary | ICD-10-CM | POA: Insufficient documentation

## 2023-10-23 DIAGNOSIS — Z139 Encounter for screening, unspecified: Secondary | ICD-10-CM | POA: Diagnosis not present

## 2023-10-23 DIAGNOSIS — K6389 Other specified diseases of intestine: Secondary | ICD-10-CM | POA: Diagnosis not present

## 2023-10-23 HISTORY — PX: COLONOSCOPY: SHX5424

## 2023-10-23 SURGERY — COLONOSCOPY
Anesthesia: General

## 2023-10-23 MED ORDER — LACTATED RINGERS IV SOLN: INTRAVENOUS | Status: DC | PRN

## 2023-10-23 MED ORDER — PHENYLEPHRINE 80 MCG/ML (10ML) SYRINGE FOR IV PUSH (FOR BLOOD PRESSURE SUPPORT)
PREFILLED_SYRINGE | INTRAVENOUS | Status: DC | PRN
  Administered 2023-10-23 (×2): 80 ug via INTRAVENOUS

## 2023-10-23 MED ORDER — PROPOFOL 10 MG/ML IV BOLUS
INTRAVENOUS | Status: DC | PRN
  Administered 2023-10-23: 125 ug/kg/min via INTRAVENOUS
  Administered 2023-10-23: 80 mg via INTRAVENOUS

## 2023-10-23 MED ORDER — LIDOCAINE 2% (20 MG/ML) 5 ML SYRINGE
INTRAMUSCULAR | Status: DC | PRN
  Administered 2023-10-23: 60 mg via INTRAVENOUS

## 2023-10-23 NOTE — Discharge Instructions (Signed)

## 2023-10-23 NOTE — Anesthesia Preprocedure Evaluation (Signed)
 Anesthesia Evaluation  Patient identified by MRN, date of birth, ID band Patient awake    Reviewed: Allergy & Precautions, H&P , NPO status , Patient's Chart, lab work & pertinent test results, reviewed documented beta blocker date and time   Airway Mallampati: II  TM Distance: >3 FB Neck ROM: full    Dental no notable dental hx.    Pulmonary neg pulmonary ROS, former smoker   Pulmonary exam normal breath sounds clear to auscultation       Cardiovascular Exercise Tolerance: Good hypertension, + CAD   Rhythm:regular Rate:Normal     Neuro/Psych negative neurological ROS  negative psych ROS   GI/Hepatic negative GI ROS, Neg liver ROS,,,  Endo/Other  negative endocrine ROS    Renal/GU negative Renal ROS  negative genitourinary   Musculoskeletal   Abdominal   Peds  Hematology negative hematology ROS (+)   Anesthesia Other Findings   Reproductive/Obstetrics negative OB ROS                              Anesthesia Physical Anesthesia Plan  ASA: 3  Anesthesia Plan: General   Post-op Pain Management:    Induction:   PONV Risk Score and Plan: Propofol  infusion  Airway Management Planned:   Additional Equipment:   Intra-op Plan:   Post-operative Plan:   Informed Consent: I have reviewed the patients History and Physical, chart, labs and discussed the procedure including the risks, benefits and alternatives for the proposed anesthesia with the patient or authorized representative who has indicated his/her understanding and acceptance.     Dental Advisory Given  Plan Discussed with: CRNA  Anesthesia Plan Comments:         Anesthesia Quick Evaluation

## 2023-10-23 NOTE — Transfer of Care (Addendum)
 Immediate Anesthesia Transfer of Care Note  Patient: Theodore Hutchinson  Procedure(s) Performed: COLONOSCOPY  Patient Location: Short Stay  Anesthesia Type:General  Level of Consciousness: drowsy and patient cooperative  Airway & Oxygen Therapy: Patient Spontanous Breathing  Post-op Assessment: Report given to RN and Post -op Vital signs reviewed and stable  Post vital signs: Reviewed and stable  Last Vitals:  Vitals Value Taken Time  BP 116/69 10/23/23   1058  Temp 36.6 10/23/23   1058  Pulse 87 10/23/23   1058  Resp 16 10/23/23   1058  SpO2 99% 10/23/23   1058    Last Pain:  Vitals:   10/23/23 1023  TempSrc:   PainSc: 0-No pain         Complications: No notable events documented.

## 2023-10-23 NOTE — Anesthesia Procedure Notes (Signed)
 Date/Time: 10/23/2023 10:23 AM  Performed by: Para Jerelene CROME, CRNAOxygen Delivery Method: Nasal cannula

## 2023-10-23 NOTE — Op Note (Signed)
 [facility] Patient Name: Theodore Hutchinson Procedure Date: 10/23/2023 10:13 AM MRN: [id number] Date of Birth: 1945-12-08 Attending MD: Deatrice Dine , MD, [id number] [name]: [id number] Age: 78 Admit Type: Outpatient Procedure:                Colonoscopy Indications:              Screening for colorectal malignant neoplasm Providers:                Deatrice Dine, MD, Crystal Page, [place] Wilson,                            Technician Referring MD:              Medicines:                Monitored Anesthesia Care Complications:            No immediate complications. Estimated Blood Loss:     Estimated blood loss was minimal. Procedure:                Pre-Anesthesia Assessment:                           - Prior to the procedure, a History and Physical                            was performed, and patient medications and                            allergies were reviewed. The patient's tolerance of                            previous anesthesia was also reviewed. The risks                            and benefits of the procedure and the sedation                            options and risks were discussed with the patient.                            All questions were answered, and informed consent                            was obtained. Prior Anticoagulants: The patient has                            taken no anticoagulant or antiplatelet agents. ASA                            Grade Assessment: II - A patient with mild systemic                            disease. After reviewing the risks and benefits,  the patient was deemed in satisfactory condition to                            undergo the procedure.                           After obtaining informed consent, the colonoscope                            was passed under direct vision. Throughout the                            procedure, the patient's blood pressure, pulse, and                            oxygen  saturations were monitored continuously. The                            [id number] ([id number]) Colon was introduced through                            the anus and advanced to the the cecum, identified                            by appendiceal orifice and ileocecal valve. The                            ileocecal valve, appendiceal orifice, and rectum                            were photographed. Scope In: 10:29:57 AM Scope Out: 10:50:28 AM Scope Withdrawal Time: 0 hours 12 minutes 19 seconds  Total Procedure Duration: 0 hours 20 minutes 31 seconds  Findings:      The perianal and digital rectal examinations were normal.      A 5 mm polyp was found in the ascending colon. The polyp was sessile.       The polyp was removed with a cold snare. Resection and retrieval were       complete.      Scattered diverticula were found in the entire colon.      Non-bleeding internal hemorrhoids were found during retroflexion. The       hemorrhoids were small. Impression:               - One 5 mm polyp in the ascending colon, removed                            with a cold snare. Resected and retrieved.                           - Diverticulosis in the entire examined colon.                           - Non-bleeding internal hemorrhoids. Moderate Sedation:      Per Anesthesia Care Recommendation:           -  Patient has a contact number available for                            emergencies. The signs and symptoms of potential                            delayed complications were discussed with the                            patient. Return to normal activities tomorrow.                            Written discharge instructions were provided to the                            patient.                           - Resume previous diet.                           - Continue present medications.                           - No repeat colonoscopy due to current age (35                            years or  older). Procedure Code(s):        --- Professional ---                           [phone], Colonoscopy, flexible; with removal of                            tumor(s), polyp(s), or other lesion(s) by snare                            technique Diagnosis Code(s):        --- Professional ---                           Z12.11, Encounter for screening for malignant                            neoplasm of colon                           D12.2, Benign neoplasm of ascending colon                           K64.8, Other hemorrhoids                           K57.30, Diverticulosis of large intestine without                            perforation or abscess without bleeding  CPT copyright 2022 American Medical Association. All rights reserved. The codes documented in this report are preliminary and upon coder review may  be revised to meet current compliance requirements. Deatrice Dine, MD Deatrice Dine, MD 10/23/2023 11:00:33 AM This report has been signed electronically. Number of Addenda: 0

## 2023-10-23 NOTE — H&P (Signed)
 Primary Care Physician:  Orpha Yancey LABOR, MD Primary Gastroenterologist:  Dr. Cinderella  Pre-Procedure History & Physical: HPI:  Theodore Hutchinson is a 78 y.o. male is here for a colonoscopy for colon cancer screening purposes.  Patient denies any family history of colorectal cancer.  No melena or hematochezia.  No abdominal pain or unintentional weight loss.  No change in bowel habits.  Overall feels well from a GI standpoint.  Past Medical History:  Diagnosis Date   History of syncope    Mild CAD    Cardiac catheterization August 2023   Mixed hyperlipidemia    Osteoarthritis    SSS (sick sinus syndrome) (HCC)    s/p PPM 02/2022   Syncope and collapse     Past Surgical History:  Procedure Laterality Date   CERVICAL DISC SURGERY     x2   CERVICAL FUSION     Dr. Gust 1'06 [facility]   HERNIA REPAIR Left    open LIH/mesh 7''16   LEFT HEART CATH AND CORONARY ANGIOGRAPHY N/A 10/05/2021   Procedure: LEFT HEART CATH AND CORONARY ANGIOGRAPHY;  Surgeon: Verlin Lonni BIRCH, MD;  Location: [facility];  Service: Cardiovascular;  Laterality: N/A;   LOOP RECORDER REMOVAL N/A 02/08/2022   Procedure: LOOP RECORDER REMOVAL;  Surgeon: Inocencio Soyla Lunger, MD;  Location: [facility];  Service: Cardiovascular;  Laterality: N/A;   PACEMAKER IMPLANT N/A 02/08/2022   Procedure: PACEMAKER IMPLANT;  Surgeon: Inocencio Soyla Lunger, MD;  Location: [facility];  Service: Cardiovascular;  Laterality: N/A;   SHOULDER ARTHROSCOPY W/ ROTATOR CUFF REPAIR Right    stenosis intestines Right    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 Authorizing Provider  rosuvastatin  (CRESTOR ) 10 MG tablet Take 1 tablet (10 mg total) by mouth daily. 03/12/23  Yes  Debera Jayson MATSU, MD    Allergies as of 10/01/2023   (No Known Allergies)    Family History  Problem Relation Age of Onset   Dementia Mother    Hypercholesterolemia Mother    Alzheimer's disease Father     Social History   Socioeconomic History   Marital status: Married    Spouse name: Not on file   Number of children: Not on file   Years of education: Not on file   Highest education level: Not on file  Occupational History   Not on file  Tobacco Use   Smoking status: Former    Current packs/day: 0.00    Average packs/day: 1 pack/day for 30.0 years (30.0 ttl pk-yrs)    Types: Cigarettes    Start date: 03/07/1977    Quit date: 03/08/2007    Years since quitting: 16.6    Passive exposure: Current   Smokeless tobacco: Never  Vaping Use   Vaping status: Never Used  Substance and Sexual Activity   Alcohol  use: Yes    Alcohol /week: 1.0 standard drink of alcohol     Types: 1 Cans of beer per week    Comment: rare social 2-3 per month   Drug use: No   Sexual activity: Not on file  Other Topics Concern   Not on file  Social History Narrative   Not on file   Social Drivers of Health  Financial Resource Strain: Not on file  Food Insecurity: No Food Insecurity (02/07/2022)   Hunger Vital Sign    Worried About Running Out of Food in the Last Year: Never true    Ran Out of Food in the Last Year: Never true  Transportation Needs: No Transportation Needs (02/07/2022)   PRAPARE - Administrator, Civil Service (Medical): No    Lack of Transportation (Non-Medical): No  Physical Activity: Not on file  Stress: Not on file  Social Connections: Not on file  Intimate Partner Violence: Not At Risk (02/07/2022)   Humiliation, Afraid, Rape, and Kick questionnaire    Fear of Current or Ex-Partner: No    Emotionally Abused: No    Physically Abused: No    Sexually Abused: No    Review of Systems: See HPI, otherwise negative ROS  Physical Exam: Vital signs in last 24  hours: Temp:  [97.7 F (36.5 C)] 97.7 F (36.5 C) (08/21 0926) Pulse Rate:  [67] 67 (08/21 0926) Resp:  [14] 14 (08/21 0926) BP: (146)/(78) 146/78 (08/21 0926) SpO2:  [98 %] 98 % (08/21 0926) Weight:  [73.5 kg] 73.5 kg (08/21 0926)   General:   Alert,  Well-developed, well-nourished, pleasant and cooperative in NAD Head:  Normocephalic and atraumatic. Eyes:  Sclera clear, no icterus.   Conjunctiva pink. Ears:  Normal auditory acuity. Nose:  No deformity, discharge,  or lesions. Msk:  Symmetrical without gross deformities. Normal posture. Extremities:  Without clubbing or edema. Neurologic:  Alert and  oriented x4;  grossly normal neurologically. Skin:  Intact without significant lesions or rashes. Psych:  Alert and cooperative. Normal mood and affect.  Impression/Plan: Theodore Hutchinson is here for a colonoscopy to be performed for colon cancer screening purposes.  The risks of the procedure including infection, bleed, or perforation as well as benefits, limitations, alternatives and imponderables have been reviewed with the patient. Questions have been answered. All parties agreeable.

## 2023-10-24 ENCOUNTER — Encounter (HOSPITAL_COMMUNITY): Payer: Self-pay | Admitting: Gastroenterology

## 2023-10-24 LAB — SURGICAL PATHOLOGY

## 2023-10-27 ENCOUNTER — Ambulatory Visit (INDEPENDENT_AMBULATORY_CARE_PROVIDER_SITE_OTHER): Payer: Self-pay | Admitting: Gastroenterology

## 2023-10-30 NOTE — Progress Notes (Signed)
 Remote pacemaker transmission.

## 2023-10-30 NOTE — Addendum Note (Signed)
 Addended by: VICCI SELLER A on: 10/30/2023 10:10 AM   Modules accepted: Orders

## 2023-10-30 NOTE — Anesthesia Postprocedure Evaluation (Signed)
 Anesthesia Post Note  Patient: Theodore Hutchinson  Procedure(s) Performed: COLONOSCOPY  Patient location during evaluation: Phase II Anesthesia Type: General Level of consciousness: awake Pain management: pain level controlled Vital Signs Assessment: post-procedure vital signs reviewed and stable Respiratory status: spontaneous breathing and respiratory function stable Cardiovascular status: blood pressure returned to baseline and stable Postop Assessment: no headache and no apparent nausea or vomiting Anesthetic complications: no Comments: Late entry   No notable events documented.   Last Vitals:  Vitals:   10/23/23 0926 10/23/23 1058  BP: (!) 146/78 116/69  Pulse: 67 87  Resp: 14 16  Temp: 36.5 C 36.6 C  SpO2: 98% 99%    Last Pain:  Vitals:   10/23/23 1058  TempSrc: Oral  PainSc: 0-No pain                 Yvonna JINNY Bosworth

## 2023-11-10 ENCOUNTER — Encounter (INDEPENDENT_AMBULATORY_CARE_PROVIDER_SITE_OTHER): Payer: Self-pay | Admitting: *Deleted

## 2023-11-10 NOTE — Progress Notes (Signed)
 Patient result letter mailed

## 2023-11-19 ENCOUNTER — Ambulatory Visit (INDEPENDENT_AMBULATORY_CARE_PROVIDER_SITE_OTHER): Payer: PPO

## 2023-11-19 DIAGNOSIS — I495 Sick sinus syndrome: Secondary | ICD-10-CM

## 2023-11-19 LAB — CUP PACEART REMOTE DEVICE CHECK
Date Time Interrogation Session: 20250917140625
Implantable Lead Connection Status: 753985
Implantable Lead Connection Status: 753985
Implantable Lead Implant Date: 20231208
Implantable Lead Implant Date: 20231208
Implantable Lead Location: 753859
Implantable Lead Location: 753860
Implantable Lead Model: 377
Implantable Lead Model: 377
Implantable Lead Serial Number: 8001170061
Implantable Lead Serial Number: 8001193938
Implantable Pulse Generator Implant Date: 20231208
Pulse Gen Model: 407145
Pulse Gen Serial Number: 1000112021

## 2023-11-22 ENCOUNTER — Ambulatory Visit: Payer: Self-pay | Admitting: Cardiovascular Disease

## 2023-11-24 NOTE — Progress Notes (Signed)
 Remote PPM Transmission

## 2024-01-12 DIAGNOSIS — R066 Hiccough: Secondary | ICD-10-CM | POA: Diagnosis not present

## 2024-01-15 DIAGNOSIS — Z6823 Body mass index (BMI) 23.0-23.9, adult: Secondary | ICD-10-CM | POA: Diagnosis not present

## 2024-01-15 DIAGNOSIS — R1314 Dysphagia, pharyngoesophageal phase: Secondary | ICD-10-CM | POA: Diagnosis not present

## 2024-01-15 DIAGNOSIS — R066 Hiccough: Secondary | ICD-10-CM | POA: Diagnosis not present

## 2024-01-22 ENCOUNTER — Encounter (INDEPENDENT_AMBULATORY_CARE_PROVIDER_SITE_OTHER): Payer: Self-pay | Admitting: *Deleted

## 2024-01-23 DIAGNOSIS — R066 Hiccough: Secondary | ICD-10-CM | POA: Diagnosis not present

## 2024-01-23 DIAGNOSIS — R109 Unspecified abdominal pain: Secondary | ICD-10-CM | POA: Diagnosis not present

## 2024-02-17 ENCOUNTER — Encounter (INDEPENDENT_AMBULATORY_CARE_PROVIDER_SITE_OTHER): Admitting: Gastroenterology

## 2024-02-18 ENCOUNTER — Ambulatory Visit: Payer: PPO

## 2024-02-18 DIAGNOSIS — I495 Sick sinus syndrome: Secondary | ICD-10-CM | POA: Diagnosis not present

## 2024-02-19 LAB — CUP PACEART REMOTE DEVICE CHECK
Battery Voltage: 85
Date Time Interrogation Session: 20251217080853
Implantable Lead Connection Status: 753985
Implantable Lead Connection Status: 753985
Implantable Lead Implant Date: 20231208
Implantable Lead Implant Date: 20231208
Implantable Lead Location: 753859
Implantable Lead Location: 753860
Implantable Lead Model: 377
Implantable Lead Model: 377
Implantable Lead Serial Number: 8001170061
Implantable Lead Serial Number: 8001193938
Implantable Pulse Generator Implant Date: 20231208
Pulse Gen Model: 407145
Pulse Gen Serial Number: 1000112021

## 2024-02-19 NOTE — Progress Notes (Signed)
 Remote PPM Transmission

## 2024-03-03 ENCOUNTER — Ambulatory Visit: Payer: Self-pay | Admitting: Cardiovascular Disease

## 2024-03-13 ENCOUNTER — Other Ambulatory Visit: Payer: Self-pay | Admitting: Cardiology
